# Patient Record
Sex: Female | Born: 2000 | Race: Black or African American | Hispanic: No | Marital: Single | Smoking: Never smoker
Health system: Southern US, Community
[De-identification: ages and names within clinical notes are randomized; demographics above are authoritative.]

## PROBLEM LIST (undated history)

## (undated) HISTORY — PX: OTHER SURGICAL HISTORY: SHX169

## (undated) HISTORY — PX: TONSILLECTOMY: SUR1361

---
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
## 2020-02-20 ENCOUNTER — Other Ambulatory Visit: Payer: Self-pay

## 2020-02-20 ENCOUNTER — Emergency Department
Admission: EM | Admit: 2020-02-20 | Discharge: 2020-02-20 | Disposition: A | Discharge status: Home / Self Care | Payer: No Typology Code available for payment source | Attending: Student in an Organized Health Care Education/Training Program | Admitting: Student in an Organized Health Care Education/Training Program

## 2020-02-20 DIAGNOSIS — R197 Diarrhea, unspecified: Secondary | ICD-10-CM | POA: Diagnosis not present

## 2020-02-20 DIAGNOSIS — R112 Nausea with vomiting, unspecified: Secondary | ICD-10-CM | POA: Diagnosis not present

## 2020-02-20 LAB — COMPREHENSIVE METABOLIC PANEL
ALT: 22 U/L (ref 0–44)
AST: 30 U/L (ref 15–41)
Albumin: 3.4 g/dL — ABNORMAL LOW (ref 3.5–5.0)
Alkaline Phosphatase: 55 U/L (ref 38–126)
Anion gap: 7 (ref 5–15)
BUN: 6 mg/dL (ref 6–20)
CO2: 27 mmol/L (ref 22–32)
Calcium: 8.9 mg/dL (ref 8.9–10.3)
Chloride: 103 mmol/L (ref 98–111)
Creatinine, Ser: 0.74 mg/dL (ref 0.44–1.00)
GFR calc Af Amer: >60 mL/min (ref >60)
GFR calc non Af Amer: >60 mL/min (ref >60)
Glucose, Bld: 86 mg/dL (ref 70–99)
Potassium: 3.6 mmol/L (ref 3.5–5.1)
Sodium: 137 mmol/L (ref 135–145)
Total Bilirubin: 0.5 mg/dL (ref 0.3–1.2)
Total Protein: 8.5 g/dL — ABNORMAL HIGH (ref 6.5–8.1)

## 2020-02-20 LAB — URINALYSIS, COMPLETE (UACMP) WITH MICROSCOPIC
Bacteria, UA: NONE SEEN
Bilirubin Urine: NEGATIVE
Glucose, UA: NEGATIVE mg/dL
Ketones, ur: NEGATIVE mg/dL
Nitrite: NEGATIVE
Protein, ur: 100 mg/dL — AB
RBC / HPF: >50 RBC/hpf — ABNORMAL HIGH (ref 0–5)
Specific Gravity, Urine: 1.026 (ref 1.005–1.030)
pH: 5 (ref 5.0–8.0)

## 2020-02-20 LAB — CBC
HCT: 34 % — ABNORMAL LOW (ref 36.0–46.0)
Hemoglobin: 10.3 g/dL — ABNORMAL LOW (ref 12.0–15.0)
MCH: 22.4 pg — ABNORMAL LOW (ref 26.0–34.0)
MCHC: 30.3 g/dL (ref 30.0–36.0)
MCV: 74.1 fL — ABNORMAL LOW (ref 80.0–100.0)
Platelets: 401 10*3/uL — ABNORMAL HIGH (ref 150–400)
RBC: 4.59 MIL/uL (ref 3.87–5.11)
RDW: 16.1 % — ABNORMAL HIGH (ref 11.5–15.5)
WBC: 5.5 10*3/uL (ref 4.0–10.5)
nRBC: 0 % (ref 0.0–0.2)

## 2020-02-20 LAB — POCT PREGNANCY, URINE: Preg Test, Ur: NEGATIVE

## 2020-02-20 LAB — LIPASE, BLOOD: Lipase: 16 U/L (ref 11–51)

## 2020-02-20 MED ORDER — ONDANSETRON 4 MG PO TBDP: 4.0000 mg | ORAL_TABLET | Freq: Three times a day (TID) | ORAL | 0 refills | Status: DC | PRN

## 2020-02-20 MED ORDER — SODIUM CHLORIDE 0.9% FLUSH: 3.0000 mL | Freq: Once | INTRAVENOUS | Status: DC

## 2020-02-20 NOTE — ED Triage Notes (Signed)
PT to ER states n/v/d since Wednesday.  States last was last night.  States today at work became lightheaded.

## 2020-02-20 NOTE — ED Notes (Signed)
See triage note- pt nvd since Wednesday. Also c/o dizziness and weakness.

## 2020-02-20 NOTE — ED Notes (Signed)
Pt given blanket.

## 2020-02-20 NOTE — Discharge Instructions (Signed)
Please follow up with primary care.  Return to the ER for symptoms that change or worsen if unable to schedule an appointment. 

## 2020-02-20 NOTE — ED Provider Notes (Signed)
[facility] Emergency Department Provider Note ____________________________________________   First MD Initiated Contact with Patient 02/20/20 1751     (approximate)  I have reviewed the triage vital signs and the nursing notes.   HISTORY  Chief Complaint Emesis, Nausea, and Diarrhea  HPI Kathy Burke is a 19 y.o. female who presents to the emergency department for treatment and evaluation of nausea, vomiting, and diarrhea x 3 days with dizziness today while at work. Last episode of vomiting or diarrhea was last night. She also has a mild sore throat that she believes is related to allergies. Some relief with zofran.     No past medical history on file.  Patient Active Problem List   Diagnosis Date Noted  . Irregular menses 03/05/2018  . ADD (attention deficit disorder) 04/05/2015  . Allergic rhinitis 04/05/2015  . Arthritis 04/05/2015  . Dermatitis, eczematoid 04/05/2015  . Encounter for physical therapy 04/05/2015  . Family history of diabetes mellitus type II 04/05/2015  . Blood glucose elevated 04/05/2015  . Cannot sleep 04/05/2015  . Anemia, iron deficiency 04/05/2015  . Adiposity 04/05/2015  . Anterior knee pain 04/05/2015    Past Surgical History:  Procedure Laterality Date  . pt denies    . TONSILLECTOMY  as a baby    Prior to Admission medications   Medication Sig Start Date End Date Taking? Authorizing Provider  amphetamine-dextroamphetamine (ADDERALL XR) 20 MG 24 hr capsule Take 1 capsule (20 mg total) by mouth daily. 08/13/19   Mar Daring, PA-C  levonorgestrel (MIRENA) 20 MCG/24HR IUD 1 each by Intrauterine route once.    [provider]  loratadine (CLARITIN) 10 MG tablet Take by mouth.    [provider]  metroNIDAZOLE (FLAGYL) 500 MG tablet Take 1 tablet (500 mg total) by mouth 2 (two) times daily. 08/13/19   Mar Daring, PA-C  ondansetron (ZOFRAN-ODT) 4 MG disintegrating tablet Take 1 tablet  (4 mg total) by mouth every 8 (eight) hours as needed for nausea or vomiting. 02/20/20   Timiyah Romito B, FNP  triamcinolone cream (KENALOG) 0.1 % Apply 1 application topically 2 (two) times daily. Apply 2-3 times a day for eczema 03/13/16   Margarita Rana, MD    Allergies Patient has no known allergies.  Family History  Problem Relation Age of Onset  . Hypertension Mother   . Hypertension Father   . Diabetes Maternal Grandmother   . Diabetes Paternal Grandmother     Social History Social History   Tobacco Use  . Smoking status: Never Smoker  . Smokeless tobacco: Never Used  Substance Use Topics  . Alcohol use: No  . Drug use: No    Review of Systems  Constitutional: No fever/chills Eyes: No visual changes. ENT: No sore throat. Cardiovascular: Denies chest pain. Respiratory: Denies shortness of breath. Gastrointestinal: No abdominal pain. Positive for nausea, vomiting, and diarrhea.  No constipation. Genitourinary: Negative for dysuria. Musculoskeletal: Negative for back pain. Skin: Negative for rash. Neurological: Negative for headaches, focal weakness or numbness. ___________________________________________   PHYSICAL EXAM:  VITAL SIGNS: ED Triage Vitals  Enc Vitals Group     BP 02/20/20 1555 102/70     Pulse Rate 02/20/20 1555 (!) 105     Resp 02/20/20 1555 16     Temp 02/20/20 1555 98.4 F (36.9 C)     Temp Source 02/20/20 1555 Oral     SpO2 02/20/20 1555 95 %     Weight 02/20/20 1555 300 lb (136.1  kg)     Height 02/20/20 1555 5\' 11"  (1.803 m)     Head Circumference --      Peak Flow --      Pain Score 02/20/20 1602 0     Pain Loc --      Pain Edu? --      Excl. in GC? --     Constitutional: Alert and oriented. Well appearing and in no acute distress. Eyes: Conjunctivae are normal. Head: Atraumatic. Nose: No congestion/rhinnorhea. Mouth/Throat: Mucous membranes are moist.  Oropharynx mildly erythematous with cobblestone exudate. Neck: No stridor.    Hematological/Lymphatic/Immunilogical: No cervical lymphadenopathy. Cardiovascular: Normal rate, regular rhythm. Grossly normal heart sounds.  Good peripheral circulation. Respiratory: Normal respiratory effort.  No retractions. Lungs CTAB. Gastrointestinal: Soft and nontender. No distention. No abdominal bruits. No CVA tenderness. Genitourinary:  Musculoskeletal: No lower extremity tenderness nor edema.  No joint effusions. Neurologic:  Normal speech and language. No gross focal neurologic deficits are appreciated. No gait instability. Skin:  Skin is warm, dry and intact. No rash noted. Psychiatric: Mood and affect are normal. Speech and behavior are normal.  ____________________________________________   LABS (all labs ordered are listed, but only abnormal results are displayed)  Labs Reviewed  COMPREHENSIVE METABOLIC PANEL - Abnormal; Notable for the following components:      Result Value   Total Protein 8.5 (*)    Albumin 3.4 (*)    All other components within normal limits  CBC - Abnormal; Notable for the following components:   Hemoglobin 10.3 (*)    HCT 34.0 (*)    MCV 74.1 (*)    MCH 22.4 (*)    RDW 16.1 (*)    Platelets 401 (*)    All other components within normal limits  URINALYSIS, COMPLETE (UACMP) WITH MICROSCOPIC - Abnormal; Notable for the following components:   Color, Urine AMBER (*)    APPearance CLOUDY (*)    Hgb urine dipstick LARGE (*)    Protein, ur 100 (*)    Leukocytes,Ua SMALL (*)    RBC / HPF >50 (*)    All other components within normal limits  LIPASE, BLOOD  POC URINE PREG, ED  POCT PREGNANCY, URINE   ____________________________________________  EKG  Not indicated. ____________________________________________  RADIOLOGY  ED MD interpretation:    Official radiology report(s): No results found.  ____________________________________________   PROCEDURES  Procedure(s) performed (including Critical  Care):  Procedures  ____________________________________________   INITIAL IMPRESSION / ASSESSMENT AND PLAN   19 year old female presenting to the emergency department for treatment and evaluation of dizziness after having had a few days of nausea, vomiting, and diarrhea.  She is tolerating fluids and food well today.  Exam is overall reassuring.  Lab studies do not indicate dehydration.  She is noted to be anemic however this is chronic.  Urinalysis shows that she has large amount hemoglobin, small amount of leukocytes, greater than 50 red blood cells with 21-50 white blood cells as well as 11-20 squamous epithelial cells.  Patient denies urinary symptoms.  No treatment indicated at this time.  DIFFERENTIAL DIAGNOSIS  Dehydration, acute kidney injury, viral gastroenteritis, COVID-19  ED COURSE  Patient will be discharged home with a prescription for Zofran and advised to take some type of over-the-counter allergy medication.  If she develops urinary symptoms or other concerns, she was advised to follow-up with her primary care provider.  If she is unable to schedule an appointment she should return to the emergency department. ____________________________________________  FINAL CLINICAL IMPRESSION(S) / ED DIAGNOSES  Final diagnoses:  Nausea vomiting and diarrhea     ED Discharge Orders         Ordered    ondansetron (ZOFRAN-ODT) 4 MG disintegrating tablet  Every 8 hours PRN     02/20/20 2013           RAYAN DYAL was evaluated in Emergency Department on 02/21/2020 for the symptoms described in the history of present illness. She was evaluated in the context of the global COVID-19 pandemic, which necessitated consideration that the patient might be at risk for infection with the SARS-CoV-2 virus that causes COVID-19. Institutional protocols and algorithms that pertain to the evaluation of patients at risk for COVID-19 are in a state of rapid change based on information  released by regulatory bodies including the CDC and federal and state organizations. These policies and algorithms were followed during the patient's care in the ED.   Note:  This document was prepared using Dragon voice recognition software and may include unintentional dictation errors.   Chinita Pester, FNP 02/21/20 2109    Willy Eddy, MD 02/23/20 [phone]

## 2020-04-16 ENCOUNTER — Ambulatory Visit: Payer: No Typology Code available for payment source | Attending: Oncology

## 2020-04-16 DIAGNOSIS — Z23 Encounter for immunization: Secondary | ICD-10-CM

## 2020-04-16 NOTE — Progress Notes (Signed)
   Covid-19 Vaccination Clinic  Name:  Kathy Burke    MRN: [id number] DOB: [birth year]  04/16/2020  [name] was observed post Covid-19 immunization for 15 minutes without incident. She was provided with Vaccine Information Sheet and instruction to access the V-Safe system.   [name] was instructed to call 911 with any severe reactions post vaccine: Marland Kitchen Difficulty breathing  . Swelling of face and throat  . A fast heartbeat  . A bad rash all over body  . Dizziness and weakness   Immunizations Administered    Name Date Dose VIS Date Route   Pfizer COVID-19 Vaccine 04/16/2020  9:45 AM 0.3 mL 01/27/2019 Intramuscular   Manufacturer: [employer], [employer]   Lot: [id number]   NDC: [address]-7

## 2020-05-10 ENCOUNTER — Ambulatory Visit: Payer: No Typology Code available for payment source | Attending: Internal Medicine

## 2020-05-10 DIAGNOSIS — Z23 Encounter for immunization: Secondary | ICD-10-CM

## 2020-05-10 NOTE — Progress Notes (Signed)
   Covid-19 Vaccination Clinic  Name:  Kathy Burke    MRN: [id number] DOB: 06-Jul-2001  05/10/2020  [name] was observed post Covid-19 immunization for 15 minutes without incident. She was provided with Vaccine Information Sheet and instruction to access the V-Safe system.   [name] was instructed to call 911 with any severe reactions post vaccine: Marland Kitchen Difficulty breathing  . Swelling of face and throat  . A fast heartbeat  . A bad rash all over body  . Dizziness and weakness   Immunizations Administered    Name Date Dose VIS Date Route   Pfizer COVID-19 Vaccine 05/10/2020  9:44 AM 0.3 mL 01/27/2019 Intramuscular   Manufacturer: [employer], [employer]   Lot: [id number]   NDC: [address]-2

## 2020-07-12 ENCOUNTER — Encounter: Payer: Self-pay | Admitting: Emergency Medicine

## 2020-07-12 ENCOUNTER — Emergency Department: Payer: No Typology Code available for payment source

## 2020-07-12 ENCOUNTER — Other Ambulatory Visit: Payer: Self-pay

## 2020-07-12 ENCOUNTER — Emergency Department
Admission: EM | Admit: 2020-07-12 | Discharge: 2020-07-12 | Disposition: A | Discharge status: Home / Self Care | Payer: No Typology Code available for payment source | Attending: Emergency Medicine | Admitting: Emergency Medicine

## 2020-07-12 DIAGNOSIS — F909 Attention-deficit hyperactivity disorder, unspecified type: Secondary | ICD-10-CM | POA: Insufficient documentation

## 2020-07-12 DIAGNOSIS — D509 Iron deficiency anemia, unspecified: Secondary | ICD-10-CM | POA: Insufficient documentation

## 2020-07-12 DIAGNOSIS — R1011 Right upper quadrant pain: Secondary | ICD-10-CM | POA: Insufficient documentation

## 2020-07-12 DIAGNOSIS — K2900 Acute gastritis without bleeding: Secondary | ICD-10-CM

## 2020-07-12 DIAGNOSIS — E669 Obesity, unspecified: Secondary | ICD-10-CM | POA: Diagnosis not present

## 2020-07-12 DIAGNOSIS — K2901 Acute gastritis with bleeding: Secondary | ICD-10-CM | POA: Diagnosis not present

## 2020-07-12 DIAGNOSIS — N3000 Acute cystitis without hematuria: Secondary | ICD-10-CM | POA: Diagnosis not present

## 2020-07-12 DIAGNOSIS — D649 Anemia, unspecified: Secondary | ICD-10-CM | POA: Insufficient documentation

## 2020-07-12 DIAGNOSIS — N3090 Cystitis, unspecified without hematuria: Secondary | ICD-10-CM

## 2020-07-12 LAB — COMPREHENSIVE METABOLIC PANEL
ALT: 25 U/L (ref 0–44)
AST: 36 U/L (ref 15–41)
Albumin: 3.5 g/dL (ref 3.5–5.0)
Alkaline Phosphatase: 55 U/L (ref 38–126)
Anion gap: 8 (ref 5–15)
BUN: 7 mg/dL (ref 6–20)
CO2: 25 mmol/L (ref 22–32)
Calcium: 9 mg/dL (ref 8.9–10.3)
Chloride: 104 mmol/L (ref 98–111)
Creatinine, Ser: 0.71 mg/dL (ref 0.44–1.00)
GFR calc Af Amer: >60 mL/min (ref >60)
GFR calc non Af Amer: >60 mL/min (ref >60)
Glucose, Bld: 82 mg/dL (ref 70–99)
Potassium: 4.4 mmol/L (ref 3.5–5.1)
Sodium: 137 mmol/L (ref 135–145)
Total Bilirubin: 0.8 mg/dL (ref 0.3–1.2)
Total Protein: 9 g/dL — ABNORMAL HIGH (ref 6.5–8.1)

## 2020-07-12 LAB — URINALYSIS, COMPLETE (UACMP) WITH MICROSCOPIC
Bilirubin Urine: NEGATIVE
Glucose, UA: NEGATIVE mg/dL
Ketones, ur: NEGATIVE mg/dL
Nitrite: NEGATIVE
Protein, ur: 30 mg/dL — AB
Specific Gravity, Urine: 1.025 (ref 1.005–1.030)
WBC, UA: >50 WBC/hpf — ABNORMAL HIGH (ref 0–5)
pH: 7 (ref 5.0–8.0)

## 2020-07-12 LAB — CBC
HCT: 35.7 % — ABNORMAL LOW (ref 36.0–46.0)
Hemoglobin: 10.6 g/dL — ABNORMAL LOW (ref 12.0–15.0)
MCH: 22.3 pg — ABNORMAL LOW (ref 26.0–34.0)
MCHC: 29.7 g/dL — ABNORMAL LOW (ref 30.0–36.0)
MCV: 75 fL — ABNORMAL LOW (ref 80.0–100.0)
Platelets: 272 10*3/uL (ref 150–400)
RBC: 4.76 MIL/uL (ref 3.87–5.11)
RDW: 17.1 % — ABNORMAL HIGH (ref 11.5–15.5)
WBC: 5.7 10*3/uL (ref 4.0–10.5)
nRBC: 0 % (ref 0.0–0.2)

## 2020-07-12 LAB — LIPASE, BLOOD: Lipase: 24 U/L (ref 11–51)

## 2020-07-12 LAB — PREGNANCY, URINE: Preg Test, Ur: NEGATIVE

## 2020-07-12 MED ORDER — FENTANYL CITRATE (PF) 100 MCG/2ML IJ SOLN: 50.0000 ug | Freq: Once | INTRAMUSCULAR | Status: DC

## 2020-07-12 MED ORDER — ALUM & MAG HYDROXIDE-SIMETH 200-200-20 MG/5ML PO SUSP
30.0000 mL | Freq: Once | ORAL | Status: AC
  Administered 2020-07-12: 30 mL via ORAL
  Filled 2020-07-12: qty 30

## 2020-07-12 MED ORDER — CEFUROXIME AXETIL 500 MG PO TABS: 500.0000 mg | ORAL_TABLET | Freq: Two times a day (BID) | ORAL | 0 refills | Status: AC

## 2020-07-12 MED ORDER — ACETAMINOPHEN 500 MG PO TABS
1000.0000 mg | ORAL_TABLET | Freq: Once | ORAL | Status: AC
  Administered 2020-07-12: 1000 mg via ORAL
  Filled 2020-07-12: qty 2

## 2020-07-12 NOTE — ED Triage Notes (Signed)
Patient presents to the ED with epigastric pain radiating into right upper quadrant. Patient states pain is sharp.  Patient states pain was worse when she tried to move during the night. Patient states pain began around noon while she was working at work.  Patient denies vomiting and diarrhea.  Patient states area is slightly tender.  Patient is in no obvious distress at this time.

## 2020-07-12 NOTE — ED Provider Notes (Addendum)
[facility] Emergency Department Provider Note  ____________________________________________   First MD Initiated Contact with Patient 07/12/20 1337     (approximate)  I have reviewed the triage vital signs and the nursing notes.   HISTORY  Chief Complaint Abdominal Pain   HPI Kathy Burke is a 19 y.o. female with a past medical history of ADD, arthritis, and iron deficiency anemia who presents for assessment of approximately 2 days of right upper quadrant pain.  Patient states it is crampy and comes and goes but sometimes is persistent.  No prior similar episodes.  No clear alleviating or aggravating factors including food or ibuprofen.  Patient does note she is not a regular ibuprofen taker.  She denies any headache, earache, sore throat, fevers, cough, chest pain, abdominal pain on the left or below the umbilicus, back pain, burning with urination, diarrhea, blood in her stool, blood in her urine, rash, or other acute complaints.  Denies EtOH use, illegal drug use, tobacco abuse.  No other acute concerns at this time.         History reviewed. No pertinent past medical history.  Patient Active Problem List   Diagnosis Date Noted  . Irregular menses 03/05/2018  . ADD (attention deficit disorder) 04/05/2015  . Allergic rhinitis 04/05/2015  . Arthritis 04/05/2015  . Dermatitis, eczematoid 04/05/2015  . Encounter for physical therapy 04/05/2015  . Family history of diabetes mellitus type II 04/05/2015  . Blood glucose elevated 04/05/2015  . Cannot sleep 04/05/2015  . Anemia, iron deficiency 04/05/2015  . Adiposity 04/05/2015  . Anterior knee pain 04/05/2015    Past Surgical History:  Procedure Laterality Date  . pt denies    . TONSILLECTOMY  as a baby    Prior to Admission medications   Medication Sig Start Date End Date Taking? Authorizing Provider  amphetamine-dextroamphetamine (ADDERALL XR) 20 MG 24 hr capsule Take 1 capsule (20 mg total)  by mouth daily. 08/13/19   [name], PA-C  cefUROXime (CEFTIN) 500 MG tablet Take 1 tablet (500 mg total) by mouth 2 (two) times daily with a meal for 7 days. 07/12/20 07/19/20  Gilles Chiquito, MD  levonorgestrel (MIRENA) 20 MCG/24HR IUD 1 each by Intrauterine route once.    [provider]  loratadine (CLARITIN) 10 MG tablet Take by mouth.    [provider]    Allergies Patient has no known allergies.  Family History  Problem Relation Age of Onset  . Hypertension Mother   . Hypertension Father   . Diabetes Maternal Grandmother   . Diabetes Paternal Grandmother     Social History Social History   Tobacco Use  . Smoking status: Never Smoker  . Smokeless tobacco: Never Used  Substance Use Topics  . Alcohol use: No  . Drug use: No    Review of Systems  Review of Systems  Constitutional: Negative for chills and fever.  HENT: Negative for sore throat.   Eyes: Negative for pain.  Respiratory: Negative for cough and stridor.   Cardiovascular: Negative for chest pain.  Gastrointestinal: Positive for abdominal pain. Negative for vomiting.  Skin: Negative for rash.  Neurological: Negative for seizures, loss of consciousness and headaches.  Psychiatric/Behavioral: Negative for suicidal ideas.  All other systems reviewed and are negative.     ____________________________________________   PHYSICAL EXAM:  VITAL SIGNS: ED Triage Vitals  Enc Vitals Group     BP 07/12/20 1234 (!) 112/58     Pulse Rate 07/12/20 1234  92     Resp 07/12/20 1234 17     Temp 07/12/20 1234 98.4 F (36.9 C)     Temp Source 07/12/20 1234 Oral     SpO2 07/12/20 1234 97 %     Weight 07/12/20 1256 280 lb (127 kg)     Height 07/12/20 1256 5\' 11"  (1.803 m)     Head Circumference --      Peak Flow --      Pain Score 07/12/20 1256 7     Pain Loc --      Pain Edu? --      Excl. in GC? --    Vitals:   07/12/20 1234  BP: (!) 112/58  Pulse: 92  Resp: 17  Temp: 98.4  F (36.9 C)  SpO2: 97%   Physical Exam Vitals and nursing note reviewed.  Constitutional:      General: She is not in acute distress.    Appearance: She is well-developed. She is obese.  HENT:     Head: Normocephalic and atraumatic.     Right Ear: External ear normal.     Left Ear: External ear normal.     Nose: Nose normal.     Mouth/Throat:     Mouth: Mucous membranes are moist.  Eyes:     Conjunctiva/sclera: Conjunctivae normal.  Cardiovascular:     Rate and Rhythm: Normal rate and regular rhythm.     Heart sounds: No murmur heard.   Pulmonary:     Effort: Pulmonary effort is normal. No respiratory distress.     Breath sounds: Normal breath sounds.  Abdominal:     General: There is no distension.     Palpations: Abdomen is soft. There is no mass.     Tenderness: There is abdominal tenderness in the right upper quadrant, epigastric area and suprapubic area. There is no right CVA tenderness, left CVA tenderness, guarding or rebound.     Hernia: No hernia is present.  Musculoskeletal:     Cervical back: Neck supple.  Skin:    General: Skin is warm and dry.     Capillary Refill: Capillary refill takes less than 2 seconds.  Neurological:     Mental Status: She is alert and oriented to person, place, and time.  Psychiatric:        Mood and Affect: Mood normal.      ____________________________________________   LABS (all labs ordered are listed, but only abnormal results are displayed)  Labs Reviewed  COMPREHENSIVE METABOLIC PANEL - Abnormal; Notable for the following components:      Result Value   Total Protein 9.0 (*)    All other components within normal limits  CBC - Abnormal; Notable for the following components:   Hemoglobin 10.6 (*)    HCT 35.7 (*)    MCV 75.0 (*)    MCH 22.3 (*)    MCHC 29.7 (*)    RDW 17.1 (*)    All other components within normal limits  URINALYSIS, COMPLETE (UACMP) WITH MICROSCOPIC - Abnormal; Notable for the following components:     Color, Urine YELLOW (*)    APPearance CLOUDY (*)    Hgb urine dipstick LARGE (*)    Protein, ur 30 (*)    Leukocytes,Ua MODERATE (*)    WBC, UA >50 (*)    Bacteria, UA RARE (*)    All other components within normal limits  URINE CULTURE  LIPASE, BLOOD  PREGNANCY, URINE   ____________________________________________  EKG  Sinus  rhythm with a ventricular rate of 90, normal axis, unremarkable intervals, no evidence of acute ischemia or other nonarrhythmia. ____________________________________________  RADIOLOGY  Official radiology report(s): US Abdomen Limited RUQ  Result Date: 07/12/2020 CLINICAL DATA:  Upper abdominal pain EXAM: ULTRASOUND ABDOMEN LIMITED RIGHT UPPER QUADRANT COMPARISON:  None. FINDINGS: Gallbladder: No gallstones or wall thickening visualized. There is no pericholecystic fluid. No sonographic Murphy sign noted by sonographer. Common bile duct: Diameter: 2 mm. No intrahepatic or extrahepatic biliary duct dilatation. Liver: No focal lesion identified. Within normal limits in parenchymal echogenicity. Portal vein is patent on color Doppler imaging with normal direction of blood flow towards the liver. Other: None. IMPRESSION: Study within normal limits. Electronically Signed   By: Bretta Bang III M.D.   On: 07/12/2020 14:40    ____________________________________________   PROCEDURES  Procedure(s) performed (including Critical Care):  Procedures   ____________________________________________   INITIAL IMPRESSION / ASSESSMENT AND PLAN / ED COURSE        Patient presents with above-stated history exam for assessment of right upper quadrant pain.  Afebrile and hemodynamically stable arrival.  Exam as above remarkable for some very minimal tenderness in the right upper quadrant and epigastric region.  Patient also has some mild tenderness in her suprapubic area but no tenderness in her right lower quadrant or left lower quadrant, no rebound tenderness,  no guarding, and no pain on ranging of the right lower extremity or left lower extremity.  Low suspicion for acute appendicitis at this time..  No evidence of acute pancreatitis, acute cholecystitis, acute cholestatic process, or other significant electrolyte or metabolic derangement on above work-up.  History and work-up is not consistent with pyelonephritis although UA is consistent with cystitis.  In addition is possible patient has some mild gastritis given her epigastric discomfort and a couple tablets of ibuprofen the last couple days.  Patient was treated with Maalox and Rx written as noted below.  Discharge stable condition.  Strict return cautions advised and discussed.  Medications  alum & mag hydroxide-simeth (MAALOX/MYLANTA) 200-200-20 MG/5ML suspension 30 mL (has no administration in time range)  acetaminophen (TYLENOL) tablet 1,000 mg (1,000 mg Oral Given 07/12/20 1442)   ____________________________________________   FINAL CLINICAL IMPRESSION(S) / ED DIAGNOSES  Final diagnoses:  RUQ pain  Acute gastritis without hemorrhage, unspecified gastritis type  Anemia, unspecified type  Cystitis     ED Discharge Orders         Ordered    cefUROXime (CEFTIN) 500 MG tablet  2 times daily with meals     Discontinue  Reprint     07/12/20 1503           Note:  This document was prepared using Dragon voice recognition software and may include unintentional dictation errors.   Gilles Chiquito, MD 07/12/20 1508    Gilles Chiquito, MD 07/12/20 [phone]

## 2020-07-14 LAB — URINE CULTURE: Culture: >=100000 — AB

## 2020-12-05 ENCOUNTER — Other Ambulatory Visit: Payer: Self-pay

## 2020-12-05 ENCOUNTER — Encounter: Payer: Self-pay | Admitting: Family Medicine

## 2020-12-05 ENCOUNTER — Ambulatory Visit (INDEPENDENT_AMBULATORY_CARE_PROVIDER_SITE_OTHER): Payer: No Typology Code available for payment source | Admitting: Family Medicine

## 2020-12-05 ENCOUNTER — Other Ambulatory Visit: Payer: Self-pay | Admitting: Family Medicine

## 2020-12-05 VITALS — BP 128/86 | HR 96 | Temp 97.3°F | Resp 16 | Ht 71.0 in | Wt 316.0 lb

## 2020-12-05 DIAGNOSIS — S39012A Strain of muscle, fascia and tendon of lower back, initial encounter: Secondary | ICD-10-CM | POA: Diagnosis not present

## 2020-12-05 MED ORDER — CYCLOBENZAPRINE HCL 5 MG PO TABS: 5.0000 mg | ORAL_TABLET | Freq: Every day | ORAL | 1 refills | Status: DC

## 2020-12-05 MED ORDER — IBUPROFEN 800 MG PO TABS: 800.0000 mg | ORAL_TABLET | Freq: Three times a day (TID) | ORAL | 1 refills | Status: DC

## 2020-12-05 NOTE — Progress Notes (Signed)
      Established patient visit   Patient: Kathy Burke   DOB: Mar 23, 2001   19 y.o. Female  MRN: [id number] Visit Date: 12/05/2020  Today's healthcare provider: Mila Merry, MD   Chief Complaint  Patient presents with  . Back Pain   Subjective    Back Pain This is a new problem. The current episode started in the past 7 days (about 5 days ). The problem is unchanged. The pain is present in the lumbar spine. The quality of the pain is described as aching and shooting. The pain is mild. The pain is the same all the time. The symptoms are aggravated by bending and twisting. She has tried NSAIDs for the symptoms. The treatment provided no relief.   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 The pain has not improved at all since then, and has moved from the left side of her back to the middle of her back.       Medications: Outpatient Medications Prior to Visit  Medication Sig  . amphetamine-dextroamphetamine (ADDERALL XR) 20 MG 24 hr capsule Take 1 capsule (20 mg total) by mouth daily.  Marland Kitchen levonorgestrel (MIRENA) 20 MCG/24HR IUD 1 each by Intrauterine route once.  . loratadine (CLARITIN) 10 MG tablet Take by mouth.   No facility-administered medications prior to visit.    Review of Systems  Musculoskeletal: Positive for back pain.      Objective    BP 128/86   Pulse 96   Temp (!) 97.3 F (36.3 C)   Resp 16   Ht 5\' 11"  (1.803 m)   Wt (!) 316 lb (143.3 kg)   BMI 44.07 kg/m    Physical Exam  General appearance: Severely obese female, cooperative and in no acute distress Head: Normocephalic, without obvious abnormality, atraumatic Respiratory: Respirations even and unlabored, normal respiratory rate MS; diffuse muscle tenderness lower thoracic and lumbar back left of and at midline. No gross deformities.    Assessment & Plan     1. Back strain, initial  encounter Relative rest, can start applying heat, may alternate with ice.   - ibuprofen (ADVIL) 800 MG tablet; Take 1 tablet (800 mg total) by mouth 3 (three) times daily.  Dispense: 30 tablet; Refill: 1 - cyclobenzaprine (FLEXERIL) 5 MG tablet; Take 1-2 tablets (5-10 mg total) by mouth at bedtime.  Dispense: 20 tablet; Refill: 1   Call if symptoms change or if not rapidly improving.       The entirety of the information documented in the History of Present Illness, Review of Systems and Physical Exam were personally obtained by me. Portions of this information were initially documented by the CMA and reviewed by me for thoroughness and accuracy.      , MD  [facility] [phone] (phone) [phone] (fax)  [facility]

## 2021-01-20 ENCOUNTER — Other Ambulatory Visit: Payer: Self-pay

## 2021-01-20 ENCOUNTER — Ambulatory Visit
Admission: EM | Admit: 2021-01-20 | Discharge: 2021-01-20 | Disposition: A | Discharge status: Home / Self Care | Payer: No Typology Code available for payment source | Attending: Internal Medicine | Admitting: Internal Medicine

## 2021-01-20 ENCOUNTER — Encounter: Payer: Self-pay | Admitting: Emergency Medicine

## 2021-01-20 ENCOUNTER — Other Ambulatory Visit: Payer: Self-pay | Admitting: Internal Medicine

## 2021-01-20 DIAGNOSIS — N760 Acute vaginitis: Secondary | ICD-10-CM | POA: Diagnosis not present

## 2021-01-20 DIAGNOSIS — B9689 Other specified bacterial agents as the cause of diseases classified elsewhere: Secondary | ICD-10-CM | POA: Diagnosis not present

## 2021-01-20 LAB — URINALYSIS, COMPLETE (UACMP) WITH MICROSCOPIC
Bilirubin Urine: NEGATIVE
Glucose, UA: NEGATIVE mg/dL
Nitrite: NEGATIVE
Specific Gravity, Urine: 1.025 (ref 1.005–1.030)
pH: 6.5 (ref 5.0–8.0)

## 2021-01-20 LAB — WET PREP, GENITAL
Sperm: NONE SEEN
Trich, Wet Prep: NONE SEEN
Yeast Wet Prep HPF POC: NONE SEEN

## 2021-01-20 LAB — PREGNANCY, URINE: Preg Test, Ur: NEGATIVE

## 2021-01-20 MED ORDER — METRONIDAZOLE 500 MG PO TABS: 500.0000 mg | ORAL_TABLET | Freq: Two times a day (BID) | ORAL | 0 refills | Status: DC

## 2021-01-20 NOTE — ED Triage Notes (Signed)
Patient states that she had upper abdominal pain, vomiting and diarrhea that started yesterday.  Patient also reports vaginal discharge and odor.  Patient denies urinary symptoms.  Patient denies any cold symptoms.

## 2021-01-20 NOTE — Discharge Instructions (Addendum)
Take medications as directed We have sent your urine for cultures If culture is positive we will give you call Tylenol or Motrin as needed for pain Return to urgent care if your symptoms worsen.

## 2021-01-20 NOTE — ED Provider Notes (Signed)
[facility]    CSN: [id number] Arrival date & time: 01/20/21  1523      History   Chief Complaint Chief Complaint  Patient presents with  . Abdominal Pain  . Vaginal Discharge  . Diarrhea    HPI Kathy Burke is a 20 y.o. female comes to urgent care with complaints of abdominal pain of a couple of days duration.  Pain is described as in both flanks with no known aggravating or relieving factors.  Patient had some nausea associated with that as well as a bout of diarrhea.  No fever or chills.  No history of constipation.  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  No trauma.  Pain is not aggravated by food  Patient also complains of discolored malodorous vaginal discharge.  This started a few days ago.  No itching.  Patient is not sexually active.  She denies douching.  No lower abdominal pain.  No abdominal distention.   She denies any dysuria, urgency or frequency. HPI  History reviewed. No pertinent past medical history.  Patient Active Problem List   Diagnosis Date Noted  . Irregular menses 03/05/2018  . ADD (attention deficit disorder) 04/05/2015  . Allergic rhinitis 04/05/2015  . Arthritis 04/05/2015  . Dermatitis, eczematoid 04/05/2015  . Encounter for physical therapy 04/05/2015  . Family history of diabetes mellitus type II 04/05/2015  . Blood glucose elevated 04/05/2015  . Cannot sleep 04/05/2015  . Anemia, iron deficiency 04/05/2015  . Adiposity 04/05/2015  . Anterior knee pain 04/05/2015    Past Surgical History:  Procedure Laterality Date  . pt denies    . TONSILLECTOMY  as a baby    OB History   No obstetric history on file.      Home Medications    Prior to Admission medications   Medication Sig Start Date End Date Taking? Authorizing Provider  ibuprofen (ADVIL) 800 MG tablet Take 1 tablet (800 mg total) by mouth 3 (three) times daily. 12/05/20  Yes Malva Limes, MD  levonorgestrel (MIRENA) 20 MCG/24HR IUD 1 each  by Intrauterine route once.   Yes [provider]  loratadine (CLARITIN) 10 MG tablet Take by mouth.   Yes [provider]  metroNIDAZOLE (FLAGYL) 500 MG tablet Take 1 tablet (500 mg total) by mouth 2 (two) times daily. 01/20/21  Yes Damain Broadus, Britta Mccreedy, MD  amphetamine-dextroamphetamine (ADDERALL XR) 20 MG 24 hr capsule Take 1 capsule (20 mg total) by mouth daily. 08/13/19 01/20/21  [name], PA-C    Family History Family History  Problem Relation Age of Onset  . Hypertension Mother   . Hypertension Father   . Diabetes Maternal Grandmother   . Diabetes Paternal Grandmother     Social History Social History   Tobacco Use  . Smoking status: Never Smoker  . Smokeless tobacco: Never Used  Vaping Use  . Vaping Use: Never used  Substance Use Topics  . Alcohol use: No  . Drug use: No     Allergies   Patient has no known allergies.   Review of Systems Review of Systems  Constitutional: Negative for chills and fever.  HENT: Negative.   Respiratory: Negative.   Gastrointestinal: Positive for abdominal pain, nausea and vomiting.  Endocrine: Negative.   Genitourinary: Negative for dysuria, frequency and urgency.  Musculoskeletal: Negative.   Neurological: Negative.   Psychiatric/Behavioral: Negative.      Physical Exam Triage Vital Signs ED Triage Vitals  Enc Vitals Group  BP 01/20/21 1546 125/76     Pulse --      Resp 01/20/21 1546 15     Temp 01/20/21 1546 98.5 F (36.9 C)     Temp Source 01/20/21 1546 Oral     SpO2 01/20/21 1546 97 %     Weight 01/20/21 1542 (!) 315 lb 14.7 oz (143.3 kg)     Height 01/20/21 1542 5\' 11"  (1.803 m)     Head Circumference --      Peak Flow --      Pain Score 01/20/21 1542 7     Pain Loc --      Pain Edu? --      Excl. in GC? --    No data found.  Updated Vital Signs BP 125/76 (BP Location: Right Arm)   Temp 98.5 F (36.9 C) (Oral)   Resp 15   Ht 5\' 11"  (1.803 m)   Wt (!) 143.3 kg   SpO2 97%    BMI 44.06 kg/m   Visual Acuity Right Eye Distance:   Left Eye Distance:   Bilateral Distance:    Right Eye Near:   Left Eye Near:    Bilateral Near:     Physical Exam Vitals and nursing note reviewed.  Constitutional:      General: She is not in acute distress.    Appearance: She is not ill-appearing.  Cardiovascular:     Rate and Rhythm: Normal rate and regular rhythm.  Abdominal:     General: Abdomen is protuberant. Bowel sounds are normal. There is no distension or abdominal bruit. There are no signs of injury.     Palpations: Abdomen is soft. There is no shifting dullness, hepatomegaly or splenomegaly.     Tenderness: There is no abdominal tenderness.     Hernia: No hernia is present.  Neurological:     Mental Status: She is alert.      UC Treatments / Results  Labs (all labs ordered are listed, but only abnormal results are displayed) Labs Reviewed  WET PREP, GENITAL - Abnormal; Notable for the following components:      Result Value   Clue Cells Wet Prep HPF POC PRESENT (*)    WBC, Wet Prep HPF POC FEW (*)    All other components within normal limits  URINALYSIS, COMPLETE (UACMP) WITH MICROSCOPIC - Abnormal; Notable for the following components:   APPearance HAZY (*)    Hgb urine dipstick LARGE (*)    Ketones, ur TRACE (*)    Protein, ur TRACE (*)    Leukocytes,Ua TRACE (*)    Bacteria, UA FEW (*)    All other components within normal limits  URINE CULTURE  PREGNANCY, URINE    EKG   Radiology No results found.  Procedures Procedures (including critical care time)  Medications Ordered in UC Medications - No data to display  Initial Impression / Assessment and Plan / UC Course  I have reviewed the triage vital signs and the nursing notes.  Pertinent labs & imaging results that were available during my care of the patient were reviewed by me and considered in my medical decision making (see chart for details).     1.  Abdominal pain likely  gastritis: Over-the-counter famotidine If pain is persistent patient will need further evaluation. Urine cultures will be sent If urine cultures are significant, we will send the patient appropriate medications  2.  Acute vaginitis-bacterial vaginosis: Metronidazole 500 mg twice daily for 7 days Avoid alcohol intake  with the medications. We will call you if your labs are abnormal. Return to urgent care if symptoms worsen.  Final Clinical Impressions(s) / UC Diagnoses   Final diagnoses:  BV (bacterial vaginosis)     Discharge Instructions     Take medications as directed We have sent your urine for cultures If culture is positive we will give you call Tylenol or Motrin as needed for pain Return to urgent care if your symptoms worsen.   ED Prescriptions    Medication Sig Dispense Auth. Provider   metroNIDAZOLE (FLAGYL) 500 MG tablet Take 1 tablet (500 mg total) by mouth 2 (two) times daily. 14 tablet Maecie Sevcik, Britta Mccreedy, MD     PDMP not reviewed this encounter.   Merrilee Jansky, MD 01/20/21 [phone]

## 2021-01-23 ENCOUNTER — Telehealth (HOSPITAL_COMMUNITY): Payer: Self-pay | Admitting: Emergency Medicine

## 2021-01-23 ENCOUNTER — Other Ambulatory Visit (HOSPITAL_COMMUNITY): Payer: Self-pay | Admitting: Internal Medicine

## 2021-01-23 ENCOUNTER — Ambulatory Visit: Payer: No Typology Code available for payment source | Admitting: Physician Assistant

## 2021-01-23 LAB — URINE CULTURE: Culture: >=100000 — AB

## 2021-01-23 MED ORDER — NITROFURANTOIN MONOHYD MACRO 100 MG PO CAPS: 100.0000 mg | ORAL_CAPSULE | Freq: Two times a day (BID) | ORAL | 0 refills | Status: DC

## 2021-02-06 ENCOUNTER — Ambulatory Visit: Payer: Self-pay | Admitting: *Deleted

## 2021-02-06 NOTE — Telephone Encounter (Signed)
Patient advised that she needs ov for evaluation. Appointment scheduled tomorrow 02/07/2021 at 1:40pm with Osvaldo Angst, PA-C.

## 2021-02-06 NOTE — Telephone Encounter (Signed)
I returned her Kathy's call Kathy Burke answered.  Kathy is on the [place].   Kathy Burke is at work.  Pt was seen on 01/20/2021 at the [facility] in [facility] and diagnosed with bacterial vaginosis.  She had abd pain, discharge and odor as her symptoms.  They did a urine culture too and it came back positive for infection.  Urgent care called them 2 days later and let them know that and called in medication for it also but Kathy wasn't sure what it was but Kathy Burke took it as prescribed.  Kathy Burke's symptoms for  The BV and UTI resolved.  She is c/o the vaginal odor returning.   It went away for a couple of days but has now returned.   She is not having any other symptoms;  No abd pain, frequency, burning in vaginal area or with urination, no itching or redness after completing the Flagyl.  Kathy Burke is at work and doesn't have time to come in this week due to her work schedule.  Kathy is requesting Dr. Sherrie Mustache call in something for the odor since she has been treated for this already.  Kathy Burke, Kathy can be reached at [phone].  I sent my notes to [facility] for Dr. Sherrie Mustache.    Reason for Disposition . [1] Symptoms of a yeast infection (i.e., itchy, white discharge, not bad smelling) AND [2] not improved > 3 days following CARE ADVICE    Diagnosed with BV on 01/20/2021 at the urgent care.  Completed course of Flagyl.  Symptoms resolved except odor has returned.  Answer Assessment - Initial Assessment Questions 1. SYMPTOM: "What's the main symptom you're concerned about?" (e.g., pain, itching, dryness)     Kathy Burke had called in.   I returned her call.  She is on the [facility].  Kathy Burke is at work.    Kathy Burke is still having vaginal odor.  She has completed the antibiotic.  No abd pain, burning, frequency, itching or discharge.   Just the odor.   It went away for a couple of days after completing the Flagyl but has returned. She was seen at the urgent care on  01/20/2021 and diagnosed with bacterial vaginosis.  Prescribed Flagyl and completed all of it.   The urgent care called her a couple of days later and said her urine culture showed infection so was prescribed a medication for that as well.   Kathy Burke did not know the name of that medication but Kathy Burke took it as instructed. 2. LOCATION: "Where is the  Odor located?" (e.g., inside/outside, left/right)     Vaginal odor. 3. ONSET: "When did the  Seen at [facility] on 01/20/2021  start?"     Bacterial Vaginosis was diagnosed. 4. PAIN: "Is there any pain?" If Yes, ask: "How bad is it?" (Scale: 1-10; mild, moderate, severe)     No abd pain or burning with urination.   No vaginal discharge. 5. ITCHING: "Is there any itching?" If Yes, ask: "How bad is it?" (Scale: 1-10; mild, moderate, severe)     No itching 6. CAUSE: "What do you think is causing the discharge?" "Have you had the same problem before? What happened then?"     *No Answer* 7. OTHER SYMPTOMS: "Do you have any other symptoms?" (e.g., fever, itching, vaginal bleeding, pain with urination, injury to genital area, vaginal foreign body)     No   Just the odor.   Her other symptoms have resolved. 8. PREGNANCY: "Is there  any chance you are pregnant?" "When was your last menstrual period?"     Not asked  Protocols used: VAGINAL [facility]

## 2021-02-07 ENCOUNTER — Ambulatory Visit (INDEPENDENT_AMBULATORY_CARE_PROVIDER_SITE_OTHER): Payer: No Typology Code available for payment source | Admitting: Physician Assistant

## 2021-02-07 ENCOUNTER — Encounter: Payer: Self-pay | Admitting: Physician Assistant

## 2021-02-07 ENCOUNTER — Other Ambulatory Visit (HOSPITAL_COMMUNITY)
Admission: RE | Admit: 2021-02-07 | Discharge: 2021-02-07 | Disposition: A | Discharge status: Home / Self Care | Payer: No Typology Code available for payment source | Source: Ambulatory Visit | Attending: Physician Assistant | Admitting: Physician Assistant

## 2021-02-07 ENCOUNTER — Other Ambulatory Visit: Payer: Self-pay

## 2021-02-07 VITALS — BP 120/69 | HR 97 | Temp 98.4°F | Wt 372.2 lb

## 2021-02-07 DIAGNOSIS — A749 Chlamydial infection, unspecified: Secondary | ICD-10-CM | POA: Diagnosis not present

## 2021-02-07 DIAGNOSIS — L750 Bromhidrosis: Secondary | ICD-10-CM | POA: Insufficient documentation

## 2021-02-07 DIAGNOSIS — N898 Other specified noninflammatory disorders of vagina: Secondary | ICD-10-CM

## 2021-02-07 LAB — POCT URINALYSIS DIPSTICK
Bilirubin, UA: NEGATIVE
Glucose, UA: NEGATIVE
Ketones, UA: NEGATIVE
Nitrite, UA: NEGATIVE
Protein, UA: NEGATIVE
Spec Grav, UA: 1.015 (ref 1.010–1.025)
Urobilinogen, UA: 2 E.U./dL — AB
pH, UA: 8 (ref 5.0–8.0)

## 2021-02-07 NOTE — Progress Notes (Signed)
Established patient visit   Patient: Kathy Burke   DOB: February 24, 2001   19 y.o. Female  MRN: [id number] Visit Date: 02/07/2021  Today's healthcare provider: Trey Sailors, PA-C   Chief Complaint  Patient presents with  . Vaginal Discharge  I,Xavyer Steenson M Christin Moline,acting as a scribe for Trey Sailors, PA-C.,have documented all relevant documentation on the behalf of Trey Sailors, PA-C,as directed by  Trey Sailors, PA-C while in the presence of Trey Sailors, PA-C.  Subjective    Vaginal Discharge The patient's primary symptoms include a genital odor, vaginal bleeding and vaginal discharge. This is a recurrent problem. The current episode started in the past 7 days. The problem occurs constantly. The problem has been unchanged. The patient is experiencing no pain. Associated symptoms include discolored urine. Pertinent negatives include no abdominal pain, back pain, dysuria, frequency or urgency. The vaginal discharge was bloody and brown. The vaginal bleeding is spotting. She has not been passing clots. She has not been passing tissue. Nothing aggravates the symptoms. She has tried nothing for the symptoms. The treatment provided no relief.    Patient reports a month of malodorous urine mostly when she uses the bathroom. She denies burning with urination. She denies dysuria. She denies frequency. She denies incontinence. She was treated for BV in 01/2021 from urgent care. She was treated with flagyl 500 mg BID x 7 days. She also had a urine test which showed 100,000 CFU of E coli which was treated with macrobid. She reports her symptoms have resolved since treatment. She is having occasional blood In her urine, she is unsure if this is from her urine stream or menstrual blood due to her having an IUD. Last period was a couple of years ago due to [place] but she will have irregular bleeding.       Medications: Outpatient Medications Prior to Visit  Medication Sig  . ibuprofen  (ADVIL) 800 MG tablet Take 1 tablet (800 mg total) by mouth 3 (three) times daily.  Marland Kitchen levonorgestrel (MIRENA) 20 MCG/24HR IUD 1 each by Intrauterine route once.  . loratadine (CLARITIN) 10 MG tablet Take by mouth.  . metroNIDAZOLE (FLAGYL) 500 MG tablet Take 1 tablet (500 mg total) by mouth 2 (two) times daily.  . nitrofurantoin, macrocrystal-monohydrate, (MACROBID) 100 MG capsule Take 1 capsule (100 mg total) by mouth 2 (two) times daily.   No facility-administered medications prior to visit.    Review of Systems  Gastrointestinal: Negative for abdominal pain.  Genitourinary: Positive for vaginal discharge. Negative for dysuria, frequency and urgency.  Musculoskeletal: Negative for back pain.       Objective    BP 120/69 (BP Location: Right Arm, Patient Position: Sitting, Cuff Size: Large)   Pulse 97   Temp 98.4 F (36.9 C) (Oral)   Wt (!) 372 lb 3.2 oz (168.8 kg)   SpO2 99%   BMI 51.91 kg/m     Physical Exam Constitutional:      Appearance: Normal appearance. She is obese.  Cardiovascular:     Rate and Rhythm: Normal rate and regular rhythm.     Heart sounds: Normal heart sounds.  Pulmonary:     Effort: Pulmonary effort is normal.     Breath sounds: Normal breath sounds.  Abdominal:     General: Bowel sounds are normal.     Palpations: Abdomen is soft.  Skin:    General: Skin is warm and dry.  Neurological:  General: No focal deficit present.     Mental Status: She is alert and oriented to person, place, and time.  Psychiatric:        Mood and Affect: Mood normal.        Behavior: Behavior normal.       Results for orders placed or performed in visit on 02/07/21  POCT Urinalysis Dipstick  Result Value Ref Range   Color, UA Yellow    Clarity, UA Cloudy    Glucose, UA Negative Negative   Bilirubin, UA Negative    Ketones, UA Negative    Spec Grav, UA 1.015 1.010 - 1.025   Blood, UA Hemolyzed: Large    pH, UA 8.0 5.0 - 8.0   Protein, UA Negative  Negative   Urobilinogen, UA 2.0 (A) 0.2 or 1.0 E.U./dL   Nitrite, UA Negative    Leukocytes, UA Small (1+) (A) Negative   Appearance     Odor      Assessment & Plan    1. Urinary body odor  Wait for labs to come back to direct treatment. Self swab received today.   - Cervicovaginal ancillary only - POCT Urinalysis Dipstick - CULTURE, URINE COMPREHENSIVE  2. Vaginal odor  - Cervicovaginal ancillary only   Return if symptoms worsen or fail to improve.      ITrey Sailors, PA-C, have reviewed all documentation for this visit. The documentation on 02/08/21 for the exam, diagnosis, procedures, and orders are all accurate and complete.  The entirety of the information documented in the History of Present Illness, Review of Systems and Physical Exam were personally obtained by me. Portions of this information were initially documented by [facility] and reviewed by me for thoroughness and accuracy.     Maryella Shivers  [facility] [phone] (phone) [phone] (fax)  [facility]

## 2021-02-08 NOTE — Patient Instructions (Signed)
Urinary Tract Infection, Adult  A urinary tract infection (UTI) is an infection of any part of the urinary tract. The urinary tract includes the kidneys, ureters, bladder, and urethra. These organs make, store, and get rid of urine in the body. An upper UTI affects the ureters and kidneys. A lower UTI affects the bladder and urethra. What are the causes? Most urinary tract infections are caused by bacteria in your genital area around your urethra, where urine leaves your body. These bacteria grow and cause inflammation of your urinary tract. What increases the risk? You are more likely to develop this condition if:  You have a urinary catheter that stays in place.  You are not able to control when you urinate or have a bowel movement (incontinence).  You are female and you: ? Use a spermicide or diaphragm for birth control. ? Have low estrogen levels. ? Are pregnant.  You have certain genes that increase your risk.  You are sexually active.  You take antibiotic medicines.  You have a condition that causes your flow of urine to slow down, such as: ? An enlarged prostate, if you are female. ? Blockage in your urethra. ? A kidney stone. ? A nerve condition that affects your bladder control (neurogenic bladder). ? Not getting enough to drink, or not urinating often.  You have certain medical conditions, such as: ? Diabetes. ? A weak disease-fighting system (immunesystem). ? Sickle cell disease. ? Gout. ? Spinal cord injury. What are the signs or symptoms? Symptoms of this condition include:  Needing to urinate right away (urgency).  Frequent urination. This may include small amounts of urine each time you urinate.  Pain or burning with urination.  Blood in the urine.  Urine that smells bad or unusual.  Trouble urinating.  Cloudy urine.  Vaginal discharge, if you are female.  Pain in the abdomen or the lower back. You may also have:  Vomiting or a decreased  appetite.  Confusion.  Irritability or tiredness.  A fever or chills.  Diarrhea. The first symptom in older adults may be confusion. In some cases, they may not have any symptoms until the infection has worsened. How is this diagnosed? This condition is diagnosed based on your medical history and a physical exam. You may also have other tests, including:  Urine tests.  Blood tests.  Tests for STIs (sexually transmitted infections). If you have had more than one UTI, a cystoscopy or imaging studies may be done to determine the cause of the infections. How is this treated? Treatment for this condition includes:  Antibiotic medicine.  Over-the-counter medicines to treat discomfort.  Drinking enough water to stay hydrated. If you have frequent infections or have other conditions such as a kidney stone, you may need to see a health care provider who specializes in the urinary tract (urologist). In rare cases, urinary tract infections can cause sepsis. Sepsis is a life-threatening condition that occurs when the body responds to an infection. Sepsis is treated in the hospital with IV antibiotics, fluids, and other medicines. Follow these instructions at home: Medicines  Take over-the-counter and prescription medicines only as told by your health care provider.  If you were prescribed an antibiotic medicine, take it as told by your health care provider. Do not stop using the antibiotic even if you start to feel better. General instructions  Make sure you: ? Empty your bladder often and completely. Do not hold urine for long periods of time. ? Empty your bladder after   sex. ? Wipe from front to back after urinating or having a bowel movement if you are female. Use each tissue only one time when you wipe.  Drink enough fluid to keep your urine pale yellow.  Keep all follow-up visits. This is important.   Contact a health care provider if:  Your symptoms do not get better after 1-2  days.  Your symptoms go away and then return. Get help right away if:  You have severe pain in your back or your lower abdomen.  You have a fever or chills.  You have nausea or vomiting. Summary  A urinary tract infection (UTI) is an infection of any part of the urinary tract, which includes the kidneys, ureters, bladder, and urethra.  Most urinary tract infections are caused by bacteria in your genital area.  Treatment for this condition often includes antibiotic medicines.  If you were prescribed an antibiotic medicine, take it as told by your health care provider. Do not stop using the antibiotic even if you start to feel better.  Keep all follow-up visits. This is important. This information is not intended to replace advice given to you by your health care provider. Make sure you discuss any questions you have with your health care provider. Document Revised: 07/01/2020 Document Reviewed: 07/01/2020 Elsevier Patient Education  2021 Elsevier Inc.  

## 2021-02-09 ENCOUNTER — Telehealth: Payer: Self-pay

## 2021-02-09 ENCOUNTER — Other Ambulatory Visit: Payer: Self-pay | Admitting: Physician Assistant

## 2021-02-09 LAB — CERVICOVAGINAL ANCILLARY ONLY
Bacterial Vaginitis (gardnerella): NEGATIVE
Candida Glabrata: NEGATIVE
Candida Vaginitis: NEGATIVE
Chlamydia: POSITIVE — AB
Comment: NEGATIVE
Comment: NEGATIVE
Comment: NEGATIVE
Comment: NEGATIVE
Comment: NEGATIVE
Comment: NORMAL
Neisseria Gonorrhea: NEGATIVE
Trichomonas: NEGATIVE

## 2021-02-09 MED ORDER — AZITHROMYCIN 500 MG PO TABS
1000.0000 mg | ORAL_TABLET | Freq: Once | ORAL | 0 refills | Status: DC
Start: 2021-02-09 — End: 2021-02-09

## 2021-02-09 NOTE — Telephone Encounter (Signed)
-----   Message from Trey Sailors, [place] sent at 02/09/2021 12:53 PM EST ----- Kathy Burke,   Your vaginal swab is positive for chlamydia. I will send you in the treatment which is 1 g of azithromycin taken one time. This will come to the pharmacy as two pills that you should take at once. This is a sexually transmitted infection so your sexual partners should be treated and you should wait one week after treatment before engaging in sexual activitu.   Best, Osvaldo Angst, PA-C

## 2021-02-09 NOTE — Addendum Note (Signed)
Addended by: Trey Sailors on: 02/09/2021 01:01 PM   Modules accepted: Orders

## 2021-02-09 NOTE — Telephone Encounter (Signed)
I called pt and pt verbalized understanding of information below.  

## 2021-02-09 NOTE — Progress Notes (Signed)
Form was submitted.

## 2021-02-10 LAB — CULTURE, URINE COMPREHENSIVE

## 2021-02-10 LAB — SPECIMEN STATUS REPORT

## 2021-02-16 ENCOUNTER — Other Ambulatory Visit: Payer: Self-pay

## 2021-02-16 ENCOUNTER — Other Ambulatory Visit: Payer: Self-pay | Admitting: Emergency Medicine

## 2021-02-16 ENCOUNTER — Emergency Department
Admission: EM | Admit: 2021-02-16 | Discharge: 2021-02-16 | Disposition: A | Discharge status: Home / Self Care | Payer: Worker's Compensation | Attending: Emergency Medicine | Admitting: Emergency Medicine

## 2021-02-16 ENCOUNTER — Encounter: Payer: Self-pay | Admitting: Emergency Medicine

## 2021-02-16 DIAGNOSIS — T23161A Burn of first degree of back of right hand, initial encounter: Secondary | ICD-10-CM | POA: Diagnosis present

## 2021-02-16 DIAGNOSIS — Y990 Civilian activity done for income or pay: Secondary | ICD-10-CM | POA: Diagnosis not present

## 2021-02-16 DIAGNOSIS — X102XXA Contact with fats and cooking oils, initial encounter: Secondary | ICD-10-CM | POA: Insufficient documentation

## 2021-02-16 DIAGNOSIS — Y9289 Other specified places as the place of occurrence of the external cause: Secondary | ICD-10-CM | POA: Diagnosis not present

## 2021-02-16 DIAGNOSIS — Y9389 Activity, other specified: Secondary | ICD-10-CM | POA: Diagnosis not present

## 2021-02-16 MED ORDER — SILVER SULFADIAZINE 1 % EX CREA: 1.0000 "application " | TOPICAL_CREAM | Freq: Every day | CUTANEOUS | 0 refills | Status: DC

## 2021-02-16 NOTE — ED Notes (Signed)
Spoke with Kathy Burke [facility]) and she requested only a UA Drug Screen at this time.

## 2021-02-16 NOTE — ED Triage Notes (Signed)
Pt burned on right hand and right forearm by grease at work.

## 2021-02-16 NOTE — Discharge Instructions (Signed)
Apply silvadene once daily.

## 2021-02-16 NOTE — ED Provider Notes (Signed)
[facility]-EMERGENCY DEPARTMENT  ____________________________________________  Time seen: Approximately 7:26 PM  I have reviewed the triage vital signs and the nursing notes.   HISTORY  Chief Complaint Burn   Historian Patient     HPI Kathy Burke is a 20 y.o. female presents to the emergency department with first-degree burns along the dorsal aspect of the right hand after patient was splashed with grease at work.  Patient states that incident occurred earlier today.  She has been able to actively move hand since injury occurred.  No other alleviating measures have been attempted.   History reviewed. No pertinent past medical history.   Immunizations up to date:  Yes.     History reviewed. No pertinent past medical history.  Patient Active Problem List   Diagnosis Date Noted  . Irregular menses 03/05/2018  . ADD (attention deficit disorder) 04/05/2015  . Allergic rhinitis 04/05/2015  . Arthritis 04/05/2015  . Dermatitis, eczematoid 04/05/2015  . Encounter for physical therapy 04/05/2015  . Family history of diabetes mellitus type II 04/05/2015  . Blood glucose elevated 04/05/2015  . Cannot sleep 04/05/2015  . Anemia, iron deficiency 04/05/2015  . Adiposity 04/05/2015  . Anterior knee pain 04/05/2015    Past Surgical History:  Procedure Laterality Date  . pt denies    . TONSILLECTOMY  as a baby    Prior to Admission medications   Medication Sig Start Date End Date Taking? Authorizing Provider  silver sulfADIAZINE (SILVADENE) 1 % cream Apply 1 application topically daily for 7 days. 02/16/21 02/23/21 Yes Pia Mau M, PA-C  ibuprofen (ADVIL) 800 MG tablet Take 1 tablet (800 mg total) by mouth 3 (three) times daily. 12/05/20   Malva Limes, MD  levonorgestrel (MIRENA) 20 MCG/24HR IUD 1 each by Intrauterine route once.    [provider]  loratadine (CLARITIN) 10 MG tablet Take by mouth.    [provider]  amphetamine-dextroamphetamine  (ADDERALL XR) 20 MG 24 hr capsule Take 1 capsule (20 mg total) by mouth daily. 08/13/19 01/20/21  [name], PA-C    Allergies Patient has no known allergies.  Family History  Problem Relation Age of Onset  . Hypertension Mother   . Hypertension Father   . Diabetes Maternal Grandmother   . Diabetes Paternal Grandmother     Social History Social History   Tobacco Use  . Smoking status: Never Smoker  . Smokeless tobacco: Never Used  Vaping Use  . Vaping Use: Never used  Substance Use Topics  . Alcohol use: No  . Drug use: No     Review of Systems  Constitutional: No fever/chills Eyes:  No discharge ENT: No upper respiratory complaints. Respiratory: no cough. No SOB/ use of accessory muscles to breath Gastrointestinal:   No nausea, no vomiting.  No diarrhea.  No constipation. Musculoskeletal: Negative for musculoskeletal pain. Skin: Patient has superficial, first-degree burn along right hand.    ____________________________________________   PHYSICAL EXAM:  VITAL SIGNS: ED Triage Vitals  Enc Vitals Group     BP 02/16/21 1821 (!) 125/93     Pulse Rate 02/16/21 1821 (!) 110     Resp 02/16/21 1821 18     Temp 02/16/21 1821 98.2 F (36.8 C)     Temp Source 02/16/21 1821 Oral     SpO2 02/16/21 1821 98 %     Weight 02/16/21 1815 (!) 360 lb (163.3 kg)     Height 02/16/21 1815 5\' 11"  (1.803 m)     Head Circumference --  Peak Flow --      Pain Score 02/16/21 1814 7     Pain Loc --      Pain Edu? --      Excl. in GC? --      Constitutional: Alert and oriented. Well appearing and in no acute distress. Eyes: Conjunctivae are normal. PERRL. EOMI. Head: Atraumatic. Cardiovascular: Normal rate, regular rhythm. Normal S1 and S2.  Good peripheral circulation. Respiratory: Normal respiratory effort without tachypnea or retractions. Lungs CTAB. Good air entry to the bases with no decreased or absent breath sounds Gastrointestinal: Bowel sounds x 4  quadrants. Soft and nontender to palpation. No guarding or rigidity. No distention. Musculoskeletal: Full range of motion to all extremities. No obvious deformities noted Neurologic:  Normal for age. No gross focal neurologic deficits are appreciated.  Skin: Patient has first-degree burn along the dorsal aspect of the right hand. Psychiatric: Mood and affect are normal for age. Speech and behavior are normal.   ____________________________________________   LABS (all labs ordered are listed, but only abnormal results are displayed)  Labs Reviewed - No data to display ____________________________________________  EKG   ____________________________________________  RADIOLOGY   No results found.  ____________________________________________    PROCEDURES  Procedure(s) performed:     Procedures     Medications - No data to display   ____________________________________________   INITIAL IMPRESSION / ASSESSMENT AND PLAN / ED COURSE  Pertinent labs & imaging results that were available during my care of the patient were reviewed by me and considered in my medical decision making (see chart for details).      Assessment and plan Burn 20 year old female presents to the emergency department after she was splashed with grease at work.  Patient has a superficial, first-degree burn along the dorsal aspect of the right hand.  She was discharged with Silvadene.  All patient questions were answered.     ____________________________________________  FINAL CLINICAL IMPRESSION(S) / ED DIAGNOSES  Final diagnoses:  Superficial burn of back of right hand, initial encounter      NEW MEDICATIONS STARTED DURING THIS VISIT:  ED Discharge Orders         Ordered    silver sulfADIAZINE (SILVADENE) 1 % cream  Daily        02/16/21 1924              This chart was dictated using voice recognition software/Dragon. Despite best efforts to proofread, errors can occur  which can change the meaning. Any change was purely unintentional.     Orvil Feil, PA-C 02/16/21 Serena Croissant    [place], MD 02/16/21 [phone]

## 2021-02-16 NOTE — ED Notes (Signed)
See triage note   Presents s/p burn  States she had hot grease splash back onto right arm  Min redness noted  No blisters noted

## 2021-02-28 ENCOUNTER — Ambulatory Visit: Payer: No Typology Code available for payment source | Admitting: Family Medicine

## 2021-03-08 ENCOUNTER — Other Ambulatory Visit: Payer: Self-pay

## 2021-03-08 ENCOUNTER — Ambulatory Visit
Admission: EM | Admit: 2021-03-08 | Discharge: 2021-03-08 | Disposition: A | Discharge status: Home / Self Care | Payer: No Typology Code available for payment source | Attending: Family Medicine | Admitting: Family Medicine

## 2021-03-08 ENCOUNTER — Telehealth: Payer: Self-pay | Admitting: Family Medicine

## 2021-03-08 DIAGNOSIS — B9689 Other specified bacterial agents as the cause of diseases classified elsewhere: Secondary | ICD-10-CM | POA: Insufficient documentation

## 2021-03-08 DIAGNOSIS — N760 Acute vaginitis: Secondary | ICD-10-CM | POA: Diagnosis not present

## 2021-03-08 LAB — WET PREP, GENITAL
Sperm: NONE SEEN
Trich, Wet Prep: NONE SEEN
Yeast Wet Prep HPF POC: NONE SEEN

## 2021-03-08 MED ORDER — METRONIDAZOLE 500 MG PO TABS
500.0000 mg | ORAL_TABLET | Freq: Two times a day (BID) | ORAL | 0 refills | Status: DC
Start: 2021-03-08 — End: 2021-04-06
  Filled 2021-03-08: qty 14, 7d supply, fill #0

## 2021-03-08 NOTE — Telephone Encounter (Signed)
error:[id number] ° °

## 2021-03-08 NOTE — ED Triage Notes (Signed)
Pt c/o vaginal odor and discharge for the past 2 weeks. Pt states she was recently treated for BV and may not have finished all her meds. Pt denies any unprotected sexual encounters and is not concerned about STDs. Pt denies urinary symptoms, f/n/v/d or other problems.

## 2021-03-08 NOTE — ED Provider Notes (Signed)
[facility]    CSN: [id number] Arrival date & time: 03/08/21  1814      History   Chief Complaint Chief Complaint  Patient presents with  . Vaginal Discharge   HPI  20 year old female presents with vaginal discharge.  Patient recently treated for bacterial vaginosis and for chlamydia.  She states that she is not concerned about STDs today.  She reports vaginal discharge and odor for the past 1.5 weeks.  No relieving factors.  No itching.  No medications or interventions tried.  Denies abdominal pain.  No urinary symptoms.  No other complaints.  Patient Active Problem List   Diagnosis Date Noted  . Irregular menses 03/05/2018  . ADD (attention deficit disorder) 04/05/2015  . Allergic rhinitis 04/05/2015  . Arthritis 04/05/2015  . Dermatitis, eczematoid 04/05/2015  . Encounter for physical therapy 04/05/2015  . Family history of diabetes mellitus type II 04/05/2015  . Blood glucose elevated 04/05/2015  . Cannot sleep 04/05/2015  . Anemia, iron deficiency 04/05/2015  . Adiposity 04/05/2015  . Anterior knee pain 04/05/2015   Past Surgical History:  Procedure Laterality Date  . pt denies    . TONSILLECTOMY  as a baby    OB History   No obstetric history on file.     Home Medications    Prior to Admission medications   Medication Sig Start Date End Date Taking? Authorizing Provider  levonorgestrel (MIRENA) 20 MCG/24HR IUD 1 each by Intrauterine route once.   Yes [provider]  metroNIDAZOLE (FLAGYL) 500 MG tablet Take 1 tablet (500 mg total) by mouth 2 (two) times daily. 03/08/21  Yes Steffany Schoenfelder G, DO  amphetamine-dextroamphetamine (ADDERALL XR) 20 MG 24 hr capsule Take 1 capsule (20 mg total) by mouth daily. 08/13/19 01/20/21  [name], PA-C  loratadine (CLARITIN) 10 MG tablet Take by mouth.  03/08/21  [provider]    Family History Family History  Problem Relation Age of Onset  . Hypertension Mother   . Hypertension  Father   . Diabetes Maternal Grandmother   . Diabetes Paternal Grandmother     Social History Social History   Tobacco Use  . Smoking status: Never Smoker  . Smokeless tobacco: Never Used  Vaping Use  . Vaping Use: Never used  Substance Use Topics  . Alcohol use: No  . Drug use: No     Allergies   Patient has no known allergies.   Review of Systems Review of Systems  Constitutional: Negative.   Genitourinary: Positive for vaginal discharge.       Vaginal odor.   Physical Exam Triage Vital Signs ED Triage Vitals  Enc Vitals Group     BP 03/08/21 1938 129/71     Pulse Rate 03/08/21 1938 94     Resp 03/08/21 1938 18     Temp 03/08/21 1938 98.3 F (36.8 C)     Temp Source 03/08/21 1938 Oral     SpO2 03/08/21 1938 100 %     Weight 03/08/21 1936 (!) 360 lb (163.3 kg)     Height 03/08/21 1936 5\' 11"  (1.803 m)     Head Circumference --      Peak Flow --      Pain Score 03/08/21 1936 0     Pain Loc --      Pain Edu? --      Excl. in GC? --    Updated Vital Signs BP 129/71 (BP Location: Left Arm)   Pulse 94  Temp 98.3 F (36.8 C) (Oral)   Resp 18   Ht 5\' 11"  (1.803 m)   Wt (!) 163.3 kg   SpO2 100%   BMI 50.21 kg/m   Visual Acuity Right Eye Distance:   Left Eye Distance:   Bilateral Distance:    Right Eye Near:   Left Eye Near:    Bilateral Near:     Physical Exam Constitutional:      General: She is not in acute distress.    Appearance: Normal appearance. She is obese.  HENT:     Head: Normocephalic and atraumatic.  Cardiovascular:     Rate and Rhythm: Normal rate and regular rhythm.  Pulmonary:     Effort: Pulmonary effort is normal.     Breath sounds: Normal breath sounds. No wheezing, rhonchi or rales.  Abdominal:     General: There is no distension.     Palpations: Abdomen is soft.     Tenderness: There is no abdominal tenderness.  Neurological:     Mental Status: She is alert.  Psychiatric:        Mood and Affect: Mood normal.         Behavior: Behavior normal.      UC Treatments / Results  Labs (all labs ordered are listed, but only abnormal results are displayed) Labs Reviewed  WET PREP, GENITAL - Abnormal; Notable for the following components:      Result Value   Clue Cells Wet Prep HPF POC PRESENT (*)    WBC, Wet Prep HPF POC MODERATE (*)    All other components within normal limits    EKG   Radiology No results found.  Procedures Procedures (including critical care time)  Medications Ordered in UC Medications - No data to display  Initial Impression / Assessment and Plan / UC Course  I have reviewed the triage vital signs and the nursing notes.  Pertinent labs & imaging results that were available during my care of the patient were reviewed by me and considered in my medical decision making (see chart for details).    20 year old female presents with bacterial vaginosis.  Treating with Flagyl.   Final Clinical Impressions(s) / UC Diagnoses   Final diagnoses:  Bacterial vaginosis   Discharge Instructions   None    ED Prescriptions    Medication Sig Dispense Auth. Provider   metroNIDAZOLE (FLAGYL) 500 MG tablet Take 1 tablet (500 mg total) by mouth 2 (two) times daily. 14 tablet 12, DO     PDMP not reviewed this encounter.   Tommie Sams, Tommie Sams 03/08/21 2035

## 2021-03-09 ENCOUNTER — Other Ambulatory Visit: Payer: Self-pay

## 2021-04-04 ENCOUNTER — Telehealth: Payer: Self-pay

## 2021-04-04 DIAGNOSIS — Z30432 Encounter for removal of intrauterine contraceptive device: Secondary | ICD-10-CM

## 2021-04-04 NOTE — Telephone Encounter (Signed)
Patient has an IUD and would like to have it removed.    Please place a referral for her with an OB/GYN ASAP.

## 2021-04-04 NOTE — Telephone Encounter (Signed)
Referred.

## 2021-04-06 ENCOUNTER — Ambulatory Visit (INDEPENDENT_AMBULATORY_CARE_PROVIDER_SITE_OTHER): Payer: No Typology Code available for payment source | Admitting: Advanced Practice Midwife

## 2021-04-06 ENCOUNTER — Other Ambulatory Visit: Payer: Self-pay

## 2021-04-06 ENCOUNTER — Encounter: Payer: Self-pay | Admitting: Advanced Practice Midwife

## 2021-04-06 DIAGNOSIS — Z30432 Encounter for removal of intrauterine contraceptive device: Secondary | ICD-10-CM | POA: Diagnosis not present

## 2021-04-06 NOTE — Progress Notes (Signed)
    GYNECOLOGY OFFICE PROCEDURE NOTE  Kathy Burke is a 20 y.o. No obstetric history on file. here for IUD removal. The patient currently has a Mirena IUD placed 3 or 4 years ago. She has monthly spotting. She no longer wants the IUD and she does not want any other birth control at this time. She has a history of irregular cycles. She understands condoms are recommended to prevent STDs.    Review of Systems  Constitutional: Negative for chills and fever.  HENT: Negative for congestion, ear discharge, ear pain, hearing loss, sinus pain and sore throat.   Eyes: Negative for blurred vision and double vision.  Respiratory: Negative for cough, shortness of breath and wheezing.   Cardiovascular: Negative for chest pain, palpitations and leg swelling.  Gastrointestinal: Negative for abdominal pain, blood in stool, constipation, diarrhea, heartburn, melena, nausea and vomiting.  Genitourinary: Negative for dysuria, flank pain, frequency, hematuria and urgency.  Musculoskeletal: Negative for back pain, joint pain and myalgias.  Skin: Negative for itching and rash.  Neurological: Negative for dizziness, tingling, tremors, sensory change, speech change, focal weakness, seizures, loss of consciousness, weakness and headaches.  Endo/Heme/Allergies: Negative for environmental allergies. Does not bruise/bleed easily.  Psychiatric/Behavioral: Negative for depression, hallucinations, memory loss, substance abuse and suicidal ideas. The patient is not nervous/anxious and does not have insomnia.     Vital Signs: BP 122/78   Ht 5\' 11"  (1.803 m)   Wt (!) 374 lb (169.6 kg)   BMI 52.16 kg/m  Constitutional: Well nourished, well developed female in no acute distress.  HEENT: normal Skin: Warm and dry.  Cardiovascular: Regular rate and rhythm.   Respiratory: Clear to auscultation bilateral. Normal respiratory effort Neuro: DTRs 2+, Cranial nerves grossly intact Psych: Alert and Oriented x3. No memory  deficits. Normal mood and affect.  MS: normal gait, normal bilateral lower extremity ROM/strength/stability.  Pelvic exam:  is limited by body habitus EGBUS: within normal limits Vagina: within normal limits and with normal mucosa  Cervix: normal appearance   IUD Removal  Patient identified, informed consent performed, consent signed.   Discussed risks of irregular bleeding, cramping, infection, malpositioning or uterine perforation of the IUD which may require further procedures. Time out was performed. Speculum placed in the vagina. The strings of the IUD were grasped and pulled using ring forceps. The IUD was successfully removed in its entirety.  Patient tolerated procedure well.    , CNM [facility] 04/06/2021, 10:38 AM

## 2021-04-24 ENCOUNTER — Ambulatory Visit: Payer: No Typology Code available for payment source | Admitting: Family Medicine

## 2021-08-22 ENCOUNTER — Other Ambulatory Visit: Payer: Self-pay

## 2021-08-22 ENCOUNTER — Ambulatory Visit
Admission: EM | Admit: 2021-08-22 | Discharge: 2021-08-22 | Disposition: A | Discharge status: Home / Self Care | Payer: No Typology Code available for payment source | Attending: Emergency Medicine | Admitting: Emergency Medicine

## 2021-08-22 DIAGNOSIS — J029 Acute pharyngitis, unspecified: Secondary | ICD-10-CM | POA: Insufficient documentation

## 2021-08-22 DIAGNOSIS — R509 Fever, unspecified: Secondary | ICD-10-CM | POA: Diagnosis not present

## 2021-08-22 DIAGNOSIS — Z20822 Contact with and (suspected) exposure to covid-19: Secondary | ICD-10-CM | POA: Diagnosis not present

## 2021-08-22 LAB — RESP PANEL BY RT-PCR (FLU A&B, COVID) ARPGX2
Influenza A by PCR: NEGATIVE
Influenza B by PCR: NEGATIVE
SARS Coronavirus 2 by RT PCR: NEGATIVE

## 2021-08-22 LAB — POCT RAPID STREP A: Streptococcus, Group A Screen (Direct): NEGATIVE

## 2021-08-22 MED ORDER — IBUPROFEN 800 MG PO TABS
800.0000 mg | ORAL_TABLET | Freq: Once | ORAL | Status: AC
  Administered 2021-08-22: 800 mg via ORAL

## 2021-08-22 MED ORDER — BENZONATATE 200 MG PO CAPS: 200.0000 mg | ORAL_CAPSULE | Freq: Three times a day (TID) | ORAL | 0 refills | Status: DC | PRN

## 2021-08-22 MED ORDER — IBUPROFEN 600 MG PO TABS: 600.0000 mg | ORAL_TABLET | Freq: Four times a day (QID) | ORAL | 0 refills | Status: DC | PRN

## 2021-08-22 MED ORDER — ACETAMINOPHEN 500 MG PO TABS
1000.0000 mg | ORAL_TABLET | Freq: Once | ORAL | Status: AC
  Administered 2021-08-22: 1000 mg via ORAL

## 2021-08-22 MED ORDER — ONDANSETRON 8 MG PO TBDP: 8.0000 mg | ORAL_TABLET | Freq: Three times a day (TID) | ORAL | 0 refills | Status: DC | PRN

## 2021-08-22 NOTE — ED Triage Notes (Signed)
Pt c/o sore throat since Sunday. Pt is concerned about strep throat. Pt also reports headache, body aches, nasal congestion and cough with hot/cold flashes. Pt also reports nausea and diarrhea, denies fever.

## 2021-08-22 NOTE — Discharge Instructions (Addendum)
Your COVID, flu PCR was negative. your rapid strep was negative today, so we have sent off a throat culture.  We will contact you and call in the appropriate antibiotics if your culture comes back positive for an infection requiring antibiotic treatment.  Give [place] a working phone number.  If you were given a prescription for antibiotics, you may want to wait and fill it until you know the results of the culture.  1 gram of Tylenol and 600 mg ibuprofen together 3-4 times a day as needed for pain.  Make sure you drink plenty of extra fluids.  Some people find salt water gargles and  Traditional Medicinal's "Throat Coat" tea helpful. Take 5 mL of liquid Benadryl and 5 mL of Maalox. Mix it together, and then hold it in your mouth for as long as you can and then swallow. You may do this 4 times a day.  Tessalon for the cough, Zofran for nausea and vomiting.  Push extra electrolyte containing fluids such as Pedialyte or Gatorade until your urine is clear.  Go to www.goodrx.com  or www.costplusdrugs.com to look up your medications. This will give you a list of where you can find your prescriptions at the most affordable prices. Or ask the pharmacist what the cash price is, or if they have any other discount programs available to help make your medication more affordable. This can be less expensive than what you would pay with insurance.

## 2021-08-22 NOTE — ED Provider Notes (Signed)
HPI  SUBJECTIVE:  Patient reports sore throat starting 2 days ago.  No aggravating factors.  She has been taking vitamins, Tylenol, Alka-Seltzer cold with improvement in her symptoms. + Fever tmax 101.7 here   No neck stiffness  + Cough + nasal congestion, rhinorrhea + Headache No Rash  +  loss of taste and smell + shortness of breath, wheezing No nausea, but 2 episodes of vomiting No diarrhea No abdominal pain     No Recent Strep, COVID, flu exposure No reflux sxs No Allergy sxs  No Breathing difficulty, voice changes, sensation of throat swelling shut No Drooling No Trismus No abx in past month.  No antipyretic in past 4-6 hrs  She got the second dose of the COVID-vaccine. Past medical history negative for asthma, frequent strep. LMP: 3 to 4 months ago.  Denies the possibility of being pregnant PMD: Malva Limes, MD    History reviewed. No pertinent past medical history.  Past Surgical History:  Procedure Laterality Date   pt denies     TONSILLECTOMY  as a baby    Family History  Problem Relation Age of Onset   Hypertension Mother    Hypertension Father    Diabetes Maternal Grandmother    Diabetes Paternal Grandmother     Social History   Tobacco Use   Smoking status: Never   Smokeless tobacco: Never  Vaping Use   Vaping Use: Never used  Substance Use Topics   Alcohol use: No   Drug use: No    No current facility-administered medications for this encounter.  Current Outpatient Medications:    benzonatate (TESSALON) 200 MG capsule, Take 1 capsule (200 mg total) by mouth 3 (three) times daily as needed for cough., Disp: 30 capsule, Rfl: 0   ibuprofen (ADVIL) 600 MG tablet, Take 1 tablet (600 mg total) by mouth every 6 (six) hours as needed., Disp: 30 tablet, Rfl: 0   ondansetron (ZOFRAN ODT) 8 MG disintegrating tablet, Take 1 tablet (8 mg total) by mouth every 8 (eight) hours as needed for nausea or vomiting., Disp: 20 tablet, Rfl: 0  No  Known Allergies   ROS  As noted in HPI.   Physical Exam  BP 121/79 (BP Location: Left Arm)   Pulse (!) 131   Temp (!) 101.7 F (38.7 C) (Oral)   Resp 18   Ht 5\' 11"  (1.803 m)   Wt (!) 142.9 kg   SpO2 97%   BMI 43.93 kg/m   Constitutional: Well developed, well nourished, no acute distress Eyes:  EOMI, conjunctiva normal bilaterally HENT: Normocephalic, atraumatic,mucus membranes moist.  No nasal congestion, normal turbinates.  Positive mild maxillary, frontal sinus tenderness.  Erythematous oropharynx, tonsils normal size.  Positive exudate.  Uvula midline.  Unable to completely visualize posterior oropharynx. Lungs: Lungs clear bilaterally, good air movement. Cardiovascular: Regular tachycardia no murmurs, rubs, gallops GI: nondistended, nontender. No appreciable splenomegaly skin: No rash, skin intact Lymph: + Anterior cervical LN.  No posterior cervical lymphadenopathy Musculoskeletal: no deformities Neurologic: Alert & oriented x 3, no focal neuro deficits Psychiatric: Speech and behavior appropriate.   ED Course   Medications  acetaminophen (TYLENOL) tablet 1,000 mg (1,000 mg Oral Given 08/22/21 1711)  ibuprofen (ADVIL) tablet 800 mg (800 mg Oral Given 08/22/21 1711)    Orders Placed This Encounter  Procedures   Resp Panel by RT-PCR (Flu A&B, Covid) Nasopharyngeal Swab    Standing Status:   Standing    Number of Occurrences:   1  Order Specific Question:   Is this test for diagnosis or screening    Answer:   Diagnosis of ill patient    Order Specific Question:   Symptomatic for COVID-19 as defined by CDC    Answer:   Yes    Order Specific Question:   Date of Symptom Onset    Answer:   08/20/2021    Order Specific Question:   Hospitalized for COVID-19    Answer:   No    Order Specific Question:   Admitted to ICU for COVID-19    Answer:   No    Order Specific Question:   Previously tested for COVID-19    Answer:   No    Order Specific Question:   Resident in  a congregate (group) care setting    Answer:   No    Order Specific Question:   Employed in healthcare setting    Answer:   No    Order Specific Question:   Pregnant    Answer:   No    Order Specific Question:   Has patient completed COVID vaccination(s) (2 doses of Pfizer/Moderna 1 dose of [employer])    Answer:   Yes    Order Specific Question:   Has patient completed COVID Booster / 3rd dose    Answer:   Unknown   Culture, group A strep    Standing Status:   Standing    Number of Occurrences:   1   Airborne and Contact precautions    Standing Status:   Standing    Number of Occurrences:   1   After Hours POC Rapid Strep A    Standing Status:   Standing    Number of Occurrences:   1   POCT rapid strep A    Standing Status:   Standing    Number of Occurrences:   1    Results for orders placed or performed during the hospital encounter of 08/22/21 (from the past 24 hour(s))  Resp Panel by RT-PCR (Flu A&B, Covid) Nasopharyngeal Swab     Status: None   Collection Time: 08/22/21  3:54 PM   Specimen: Nasopharyngeal Swab; Nasopharyngeal(NP) swabs in vial transport medium  Result Value Ref Range   SARS Coronavirus 2 by RT PCR NEGATIVE NEGATIVE   Influenza A by PCR NEGATIVE NEGATIVE   Influenza B by PCR NEGATIVE NEGATIVE  POCT rapid strep A     Status: None   Collection Time: 08/22/21  5:05 PM  Result Value Ref Range   Streptococcus, Group A Screen (Direct) NEGATIVE NEGATIVE   No results found.  ED Clinical Impression  1. Exudative pharyngitis   2. Febrile illness      ED Assessment/Plan  Flu, COVID PCR negative.   Patient with an exudative pharyngitis.  She has mild sinus tenderness, but no nasal congestion or evidence of obstruction.  Doubt sinusitis.  Did not check mono due to high rate of false negatives early on in the illness.  Rapid strep negative. Obtaining throat culture to guide antibiotic treatment. Discussed this with patient.  We'll contact ther if  culture is positive, and will call in Appropriate antibiotics. Patient home with ibuprofen, Tylenol, Benadryl/Maalox mixture, Tessalon, Zofran, push fluids, Mucinex D work note for 2 to 3 days.  Follow-up with PMD as necessary.  ER return precautions given.   Discussed labs,  MDM, plan and followup with patient. Discussed sn/sx that should prompt return to the ED. patient agrees with plan.  Meds ordered this encounter  Medications   acetaminophen (TYLENOL) tablet 1,000 mg   ibuprofen (ADVIL) tablet 800 mg   benzonatate (TESSALON) 200 MG capsule    Sig: Take 1 capsule (200 mg total) by mouth 3 (three) times daily as needed for cough.    Dispense:  30 capsule    Refill:  0   ibuprofen (ADVIL) 600 MG tablet    Sig: Take 1 tablet (600 mg total) by mouth every 6 (six) hours as needed.    Dispense:  30 tablet    Refill:  0   ondansetron (ZOFRAN ODT) 8 MG disintegrating tablet    Sig: Take 1 tablet (8 mg total) by mouth every 8 (eight) hours as needed for nausea or vomiting.    Dispense:  20 tablet    Refill:  0     *This clinic note was created using Scientist, clinical (histocompatibility and immunogenetics). Therefore, there may be occasional mistakes despite careful proofreading.     Domenick Gong, MD 08/23/21 1757

## 2021-08-25 LAB — CULTURE, GROUP A STREP (THRC)

## 2021-09-18 ENCOUNTER — Ambulatory Visit
Admission: EM | Admit: 2021-09-18 | Discharge: 2021-09-18 | Disposition: A | Discharge status: Home / Self Care | Payer: No Typology Code available for payment source | Attending: Nurse Practitioner | Admitting: Nurse Practitioner

## 2021-09-18 ENCOUNTER — Other Ambulatory Visit: Payer: Self-pay

## 2021-09-18 DIAGNOSIS — S161XXA Strain of muscle, fascia and tendon at neck level, initial encounter: Secondary | ICD-10-CM

## 2021-09-18 DIAGNOSIS — S39012A Strain of muscle, fascia and tendon of lower back, initial encounter: Secondary | ICD-10-CM

## 2021-09-18 DIAGNOSIS — S39011A Strain of muscle, fascia and tendon of abdomen, initial encounter: Secondary | ICD-10-CM | POA: Diagnosis not present

## 2021-09-18 MED ORDER — METHOCARBAMOL 500 MG PO TABS
500.0000 mg | ORAL_TABLET | Freq: Three times a day (TID) | ORAL | 0 refills | Status: DC | PRN
  Filled 2021-09-18: qty 20, 7d supply, fill #0

## 2021-09-18 MED ORDER — NAPROXEN 500 MG PO TABS
500.0000 mg | ORAL_TABLET | Freq: Two times a day (BID) | ORAL | 0 refills | Status: DC | PRN
  Filled 2021-09-18: qty 30, 15d supply, fill #0

## 2021-09-18 MED ORDER — METHYLPREDNISOLONE SODIUM SUCC 40 MG IJ SOLR
80.0000 mg | Freq: Once | INTRAMUSCULAR | Status: AC
  Administered 2021-09-18: 80 mg via INTRAMUSCULAR
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
## 2021-11-21 ENCOUNTER — Ambulatory Visit
Admission: EM | Admit: 2021-11-21 | Discharge: 2021-11-21 | Disposition: A | Discharge status: Home / Self Care | Payer: No Typology Code available for payment source | Attending: Internal Medicine | Admitting: Internal Medicine

## 2021-11-21 ENCOUNTER — Other Ambulatory Visit: Payer: Self-pay

## 2021-11-21 DIAGNOSIS — Z20822 Contact with and (suspected) exposure to covid-19: Secondary | ICD-10-CM | POA: Diagnosis not present

## 2021-11-21 DIAGNOSIS — J069 Acute upper respiratory infection, unspecified: Secondary | ICD-10-CM | POA: Diagnosis not present

## 2021-11-21 DIAGNOSIS — F909 Attention-deficit hyperactivity disorder, unspecified type: Secondary | ICD-10-CM | POA: Insufficient documentation

## 2021-11-21 DIAGNOSIS — J029 Acute pharyngitis, unspecified: Secondary | ICD-10-CM | POA: Insufficient documentation

## 2021-11-21 DIAGNOSIS — R197 Diarrhea, unspecified: Secondary | ICD-10-CM | POA: Insufficient documentation

## 2021-11-21 DIAGNOSIS — R059 Cough, unspecified: Secondary | ICD-10-CM | POA: Diagnosis present

## 2021-11-21 LAB — RESP PANEL BY RT-PCR (FLU A&B, COVID) ARPGX2
Influenza A by PCR: NEGATIVE
Influenza B by PCR: NEGATIVE
SARS Coronavirus 2 by RT PCR: NEGATIVE

## 2021-11-21 NOTE — ED Provider Notes (Signed)
[facility]    CSN: [id number] Arrival date & time: 11/21/21  1432      History   Chief Complaint Chief Complaint  Patient presents with   Cough   Diarrhea    HPI Kathy Burke is a 20 y.o. female.   20 year old female presents with nasal congestion, headache, cough, chills for the past 2 to 3 days.  Also vomited 2 days ago and still slightly nauseous.  Having occasional diarrhea and sore throat.  No fever.  Has taken OTC cough medications with some relief.  Requests rapid flu and COVID testing today.  Has history of ADHD and environmental allergies.  Takes no daily medication.   The history is provided by the patient.   History reviewed. No pertinent past medical history.  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      Home Medications    Prior to Admission medications   Medication Sig Start Date End Date Taking? Authorizing Provider  amphetamine-dextroamphetamine (ADDERALL XR) 20 MG 24 hr capsule Take 1 capsule (20 mg total) by mouth daily. 08/13/19 01/20/21  [name], PA-C  loratadine (CLARITIN) 10 MG tablet Take by mouth.  03/08/21  [provider]    Family History Family History  Problem Relation Age of Onset   Hypertension Mother    Hypertension Father    Diabetes Maternal Grandmother    Diabetes Paternal Grandmother     Social History Social History   Tobacco Use   Smoking status: Never   Smokeless tobacco: Never  Vaping Use   Vaping Use: Never used  Substance Use Topics   Alcohol use: No   Drug use: No      Allergies   Patient has no known allergies.   Review of Systems Review of Systems  Constitutional:  Positive for activity change, appetite change, chills and fatigue. Negative for fever.  HENT:  Positive for congestion, postnasal drip, sinus pressure and sore throat. Negative for ear discharge, ear pain, facial swelling, hearing loss, mouth sores, nosebleeds and trouble swallowing.   Eyes:  Negative for pain, discharge, redness and itching.  Respiratory:  Positive for cough. Negative for chest tightness and shortness of breath.   Gastrointestinal:  Positive for diarrhea, nausea and vomiting (last vomited 2 days ago.).  Musculoskeletal:  Positive for arthralgias and myalgias. Negative for neck pain and neck stiffness.  Skin:  Negative for color change and rash.  Allergic/Immunologic: Positive for environmental allergies. Negative for food allergies and immunocompromised state.  Neurological:  Positive for headaches. Negative for dizziness, tremors, seizures, syncope and speech difficulty.  Hematological:  Negative for adenopathy. Does not bruise/bleed easily.    Physical Exam Triage Vital Signs ED Triage Vitals  Enc Vitals Group     BP 11/21/21 1558 118/63     Pulse Rate 11/21/21 1558 98     Resp 11/21/21 1558 18     Temp 11/21/21 1558 98.7 F (37.1 C)     Temp Source 11/21/21 1558 Oral     SpO2 11/21/21 1558  98 %     Weight 11/21/21 1554 (!) 350 lb (158.8 kg)     Height 11/21/21 1554 5\' 11"  (1.803 m)     Head Circumference --      Peak Flow --      Pain Score 11/21/21 1554 7     Pain Loc --      Pain Edu? --      Excl. in GC? --    No data found.  Updated Vital Signs BP 118/63 (BP Location: Left Arm)    Pulse 98    Temp 98.7 F (37.1 C) (Oral)    Resp 18    Ht 5\' 11"  (1.803 m)    Wt (!) 350 lb (158.8 kg)    LMP 10/22/2021    SpO2 98%    BMI 48.82 kg/m   Visual Acuity Right Eye Distance:   Left Eye Distance:   Bilateral Distance:    Right Eye Near:   Left Eye  Near:    Bilateral Near:     Physical Exam Vitals and nursing note reviewed.  Constitutional:      General: She is awake. She is not in acute distress.    Appearance: She is well-developed and well-groomed. She is ill-appearing.     Comments: She is sitting on the exam table in no acute distress but appears tired and ill.   HENT:     Head: Normocephalic and atraumatic.     Right Ear: Hearing, ear canal and external ear normal. Tympanic membrane is bulging. Tympanic membrane is not injected or erythematous.     Left Ear: Hearing, ear canal and external ear normal. Tympanic membrane is bulging. Tympanic membrane is not injected or erythematous.     Nose: Congestion present.     Right Sinus: No maxillary sinus tenderness or frontal sinus tenderness.     Left Sinus: No maxillary sinus tenderness or frontal sinus tenderness.     Mouth/Throat:     Lips: Pink.     Mouth: Mucous membranes are moist.     Pharynx: Uvula midline. Posterior oropharyngeal erythema present. No pharyngeal swelling, oropharyngeal exudate or uvula swelling.  Eyes:     Extraocular Movements: Extraocular movements intact.     Conjunctiva/sclera: Conjunctivae normal.  Cardiovascular:     Rate and Rhythm: Normal rate and regular rhythm.     Heart sounds: Normal heart sounds. No murmur heard. Pulmonary:     Effort: Pulmonary effort is normal. No respiratory distress.     Breath sounds: Normal breath sounds and air entry. No decreased air movement. No decreased breath sounds, wheezing, rhonchi or rales.  Musculoskeletal:     Cervical back: Normal range of motion and neck supple.  Lymphadenopathy:     Cervical: No cervical adenopathy.  Skin:    General: Skin is warm and dry.     Capillary Refill: Capillary refill takes less than 2 seconds.     Findings: No rash.  Neurological:     General: No focal deficit present.     Mental Status: She is alert and oriented to person, place, and time.  Psychiatric:        Mood and  Affect: Mood normal.        Behavior: Behavior normal. Behavior is cooperative.        Thought Content: Thought content normal.        Judgment: Judgment normal.     UC Treatments / Results  Labs (all labs ordered are listed, but only  abnormal results are displayed) Labs Reviewed  RESP PANEL BY RT-PCR (FLU A&B, COVID) [id number]    EKG   Radiology No results found.  Procedures Procedures (including critical care time)  Medications Ordered in UC Medications - No data to display  Initial Impression / Assessment and Plan / UC Course  I have reviewed the triage vital signs and the nursing notes.  Pertinent labs & imaging results that were available during my care of the patient were reviewed by me and considered in my medical decision making (see chart for details).     Reviewed negative rapid influenza and COVID-19 test results with patient.  Discussed that she probably has a viral illness.  Recommend continue OTC cough and cold medication as directed.  May take Ibuprofen 600 mg or Tylenol 1000 mg every 8 hours as needed for sore throat/headache but do not take this if listed on the OTC cold cough medication that you decide to take.  Continue to push fluids to help loosen up mucus in sinuses and chest.  Note written for work.  Follow-up in 3 to 4 days if not improving. Final Clinical Impressions(s) / UC Diagnoses   Final diagnoses:  Viral upper respiratory illness  Sore throat  Diarrhea, unspecified type     Discharge Instructions      Recommend continue OTC cough and cold medication- encouraged to take Ibuprofen 600mg  or Tylenol 1000mg  every 8 hours as needed for sore throat/headache but do not take additional Ibuprofen or Acetaminophen if it is listed in the cough/cold medication you are taking. Continue to push fluids to help loosen up mucus in sinuses and chest. Follow-up in 3 to 4 days if not improving.     ED Prescriptions   None    PDMP not reviewed this  encounter.   , NP 11/22/21 1126

## 2021-11-21 NOTE — Discharge Instructions (Signed)
Recommend continue OTC cough and cold medication- encouraged to take Ibuprofen 600mg  or Tylenol 1000mg  every 8 hours as needed for sore throat/headache but do not take additional Ibuprofen or Acetaminophen if it is listed in the cough/cold medication you are taking. Continue to push fluids to help loosen up mucus in sinuses and chest. Follow-up in 3 to 4 days if not improving.

## 2021-11-21 NOTE — ED Triage Notes (Signed)
Pt c/o nasal congestion, headache, cough, diarrhea, vomiting, body chills x2-3days. Pt has been taking OTC cough medicine. Pt has been around sick people recently at work. Pt wanted to be tested for covid and the flu.

## 2021-12-14 ENCOUNTER — Other Ambulatory Visit: Payer: Self-pay

## 2021-12-14 ENCOUNTER — Telehealth: Payer: No Typology Code available for payment source | Admitting: Physician Assistant

## 2021-12-14 DIAGNOSIS — J069 Acute upper respiratory infection, unspecified: Secondary | ICD-10-CM | POA: Diagnosis not present

## 2021-12-14 DIAGNOSIS — B9689 Other specified bacterial agents as the cause of diseases classified elsewhere: Secondary | ICD-10-CM | POA: Diagnosis not present

## 2021-12-14 MED ORDER — BENZONATATE 100 MG PO CAPS
100.0000 mg | ORAL_CAPSULE | Freq: Three times a day (TID) | ORAL | 0 refills | Status: DC | PRN
  Filled 2021-12-14: qty 30, 10d supply, fill #0

## 2021-12-14 MED ORDER — AMOXICILLIN 500 MG PO CAPS
500.0000 mg | ORAL_CAPSULE | Freq: Two times a day (BID) | ORAL | 0 refills | Status: AC
  Filled 2021-12-14: qty 20, 10d supply, fill #0

## 2021-12-14 MED ORDER — IPRATROPIUM BROMIDE 0.03 % NA SOLN
2.0000 | Freq: Two times a day (BID) | NASAL | 0 refills | Status: DC
  Filled 2021-12-14: qty 30, 30d supply, fill #0

## 2021-12-14 NOTE — Progress Notes (Signed)
Virtual Visit Consent   Kathy Burke, you are scheduled for a virtual visit with a [facility] provider today.     Just as with appointments in the office, your consent must be obtained to participate.  Your consent will be active for this visit and any virtual visit you may have with one of our providers in the next 365 days.     If you have a MyChart account, a copy of this consent can be sent to you electronically.  All virtual visits are billed to your insurance company just like a traditional visit in the office.    As this is a virtual visit, video technology does not allow for your provider to perform a traditional examination.  This may limit your provider's ability to fully assess your condition.  If your provider identifies any concerns that need to be evaluated in person or the need to arrange testing (such as labs, EKG, etc.), we will make arrangements to do so.     Although advances in technology are sophisticated, we cannot ensure that it will always work on either your end or our end.  If the connection with a video visit is poor, the visit may have to be switched to a telephone visit.  With either a video or telephone visit, we are not always able to ensure that we have a secure connection.     I need to obtain your verbal consent now.   Are you willing to proceed with your visit today?    EMIAH PELLICANO has provided verbal consent on 12/14/2021 for a virtual visit (video or telephone).   [name], PA-C   Date: 12/14/2021 8:36 AM   Virtual Visit via Video Note   I, [name], connected with  Kathy Burke  ([id number], Sep 04, 2001) on 12/14/21 at  8:30 AM EST by a video-enabled telemedicine application and verified that I am speaking with the correct person using two identifiers.  Location: Patient: Virtual Visit Location Patient: Home Provider: Virtual Visit Location Provider: Home Office   I discussed the limitations of evaluation and management by  telemedicine and the availability of in person appointments. The patient expressed understanding and agreed to proceed.    History of Present Illness: Kathy Burke is a 21 y.o. who identifies as a female who was assigned female at birth, and is being seen today for URI symptoms.  HPI: URI  This is a new problem. The current episode started 1 to 4 weeks ago. The problem has been gradually worsening. Maximum temperature: subjective fever. Associated symptoms include congestion, coughing (mixed), ear pain (right; left mild), headaches, rhinorrhea, sinus pain and a sore throat. Pertinent negatives include no diarrhea, nausea or vomiting. Associated symptoms comments: chills. Treatments tried: different medicines but her mother has given them so she does not know names. The treatment provided mild relief.     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   Family history of diabetes mellitus type II 04/05/2015   Adiposity 04/05/2015    Allergies: No Known Allergies Medications:  Current Outpatient Medications:    amoxicillin (AMOXIL) 500 MG capsule, Take 1 capsule (500 mg total) by mouth 2 (two) times daily for 10 days., Disp: 20 capsule, Rfl: 0   benzonatate (TESSALON) 100 MG capsule, Take 1 capsule (100  mg total) by mouth 3 (three) times daily as needed., Disp: 30 capsule, Rfl: 0   ipratropium (ATROVENT) 0.03 % nasal spray, Place 2 sprays into both nostrils every 12 (twelve) hours., Disp: 30 mL, Rfl: 0  Observations/Objective: Patient is well-developed, well-nourished in no acute distress.  Resting comfortably at home.  Head is normocephalic, atraumatic.  No labored breathing.  Speech is clear and coherent with logical content.  Patient is alert and oriented at baseline.    Assessment and Plan: 1.  Bacterial upper respiratory infection - amoxicillin (AMOXIL) 500 MG capsule; Take 1 capsule (500 mg total) by mouth 2 (two) times daily for 10 days.  Dispense: 20 capsule; Refill: 0 - ipratropium (ATROVENT) 0.03 % nasal spray; Place 2 sprays into both nostrils every 12 (twelve) hours.  Dispense: 30 mL; Refill: 0 - benzonatate (TESSALON) 100 MG capsule; Take 1 capsule (100 mg total) by mouth 3 (three) times daily as needed.  Dispense: 30 capsule; Refill: 0  - Symptoms present over a week without any improvements - Amoxicillin added - Ipratropium bromide nasal spray and tessalon perles for symptomatic management of cough, congestion and drainage - Chloraseptic spray and/or salt water gargles for sore throat - Push fluids - Steam treatments and/or humidifier - Rest - Seek in person evaluation if not improving or if symptoms worsen  Follow Up Instructions: I discussed the assessment and treatment plan with the patient. The patient was provided an opportunity to ask questions and all were answered. The patient agreed with the plan and demonstrated an understanding of the instructions.  A copy of instructions were sent to the patient via MyChart unless otherwise noted below.   The patient was advised to call back or seek an in-person evaluation if the symptoms worsen or if the condition fails to improve as anticipated.  Time:  I spent 13 minutes with the patient via telehealth technology discussing the above problems/concerns.    [name], PA-C

## 2021-12-14 NOTE — Patient Instructions (Signed)
Kathy Burke, thank you for joining [name], PA-C for today's virtual visit.  While this provider is not your primary care provider (PCP), if your PCP is located in our provider database this encounter information will be shared with them immediately following your visit.  Consent: (Patient) Kathy Burke provided verbal consent for this virtual visit at the beginning of the encounter.  Current Medications:  Current Outpatient Medications:    amoxicillin (AMOXIL) 500 MG capsule, Take 1 capsule (500 mg total) by mouth 2 (two) times daily for 10 days., Disp: 20 capsule, Rfl: 0   benzonatate (TESSALON) 100 MG capsule, Take 1 capsule (100 mg total) by mouth 3 (three) times daily as needed., Disp: 30 capsule, Rfl: 0   ipratropium (ATROVENT) 0.03 % nasal spray, Place 2 sprays into both nostrils every 12 (twelve) hours., Disp: 30 mL, Rfl: 0   Medications ordered in this encounter:  Meds ordered this encounter  Medications   amoxicillin (AMOXIL) 500 MG capsule    Sig: Take 1 capsule (500 mg total) by mouth 2 (two) times daily for 10 days.    Dispense:  20 capsule    Refill:  0    Order Specific Question:   Supervising Provider    Answer:   MILLER, BRIAN [[id number]]   ipratropium (ATROVENT) 0.03 % nasal spray    Sig: Place 2 sprays into both nostrils every 12 (twelve) hours.    Dispense:  30 mL    Refill:  0    Order Specific Question:   Supervising Provider    Answer:   MILLER, BRIAN [[id number]]   benzonatate (TESSALON) 100 MG capsule    Sig: Take 1 capsule (100 mg total) by mouth 3 (three) times daily as needed.    Dispense:  30 capsule    Refill:  0    Order Specific Question:   Supervising Provider    Answer:   Hyacinth Meeker, BRIAN [[id number]]     *If you need refills on other medications prior to your next appointment, please contact your pharmacy*  Follow-Up: Call back or seek an in-person evaluation if the symptoms worsen or if the condition fails to improve as anticipated.  Other  Instructions Upper Respiratory Infection, Adult An upper respiratory infection (URI) affects the nose, throat, and upper airways that lead to the lungs. The most common type of URI is often called the common cold. URIs usually get better on their own, without medical treatment. What are the causes? A URI is caused by a germ (virus). You may catch these germs by: Breathing in droplets from an infected person's cough or sneeze. Touching something that has the germ on it (is contaminated) and then touching your mouth, nose, or eyes. What increases the risk? You are more likely to get a URI if: You are very young or very old. You have close contact with others, such as at work, school, or a health care facility. You smoke. You have long-term (chronic) heart or lung disease. You have a weakened disease-fighting system (immune system). You have nasal allergies or asthma. You have a lot of stress. You have poor nutrition. What are the signs or symptoms? Runny or stuffy (congested) nose. Cough. Sneezing. Sore throat. Headache. Feeling tired (fatigue). Fever. Not wanting to eat as much as usual. Pain in your forehead, behind your eyes, and over your cheekbones (sinus pain). Muscle aches. Redness or irritation of the eyes. Pressure in the ears or face. How is this treated? URIs usually get  better on their own within 7-10 days. Medicines cannot cure URIs, but your doctor may recommend certain medicines to help relieve symptoms, such as: Over-the-counter cold medicines. Medicines to reduce coughing (cough suppressants). Coughing is a type of defense against infection that helps to clear the nose, throat, windpipe, and lungs (respiratory system). Take these medicines only as told by your doctor. Medicines to lower your fever. Follow these instructions at home: Activity Rest as needed. If you have a fever, stay home from work or school until your fever is gone, or until your doctor says you  may return to work or school. You should stay home until you cannot spread the infection anymore (you are not contagious). Your doctor may have you wear a face mask so you have less risk of spreading the infection. Relieving symptoms Rinse your mouth often with salt water. To make salt water, dissolve -1 tsp (3-6 g) of salt in 1 cup (237 mL) of warm water. Use a cool-mist humidifier to add moisture to the air. This can help you breathe more easily. Eating and drinking  Drink enough fluid to keep your pee (urine) pale yellow. Eat soups and other clear broths. General instructions  Take over-the-counter and prescription medicines only as told by your doctor. Do not smoke or use any products that contain nicotine or tobacco. If you need help quitting, ask your doctor. Avoid being where people are smoking (avoid secondhand smoke). Stay up to date on all your shots (immunizations), and get the flu shot every year. Keep all follow-up visits. How to prevent the spread of infection to others  Wash your hands with soap and water for at least 20 seconds. If you cannot use soap and water, use hand sanitizer. Avoid touching your mouth, face, eyes, or nose. Cough or sneeze into a tissue or your sleeve or elbow. Do not cough or sneeze into your hand or into the air. Contact a doctor if: You are getting worse, not better. You have any of these: A fever or chills. Brown or red mucus in your nose. Yellow or brown fluid (discharge)coming from your nose. Pain in your face, especially when you bend forward. Swollen neck glands. Pain when you swallow. White areas in the back of your throat. Get help right away if: You have shortness of breath that gets worse. You have very bad or constant: Headache. Ear pain. Pain in your forehead, behind your eyes, and over your cheekbones (sinus pain). Chest pain. You have long-lasting (chronic) lung disease along with any of these: Making high-pitched  whistling sounds when you breathe, most often when you breathe out (wheezing). Long-lasting cough (more than 14 days). Coughing up blood. A change in your usual mucus. You have a stiff neck. You have changes in your: Vision. Hearing. Thinking. Mood. These symptoms may be an emergency. Get help right away. Call 911. Do not wait to see if the symptoms will go away. Do not drive yourself to the hospital. Summary An upper respiratory infection (URI) is caused by a germ (virus). The most common type of URI is often called the common cold. URIs usually get better within 7-10 days. Take over-the-counter and prescription medicines only as told by your doctor. This information is not intended to replace advice given to you by your health care provider. Make sure you discuss any questions you have with your health care provider. Document Revised: 06/21/2021 Document Reviewed: 06/21/2021 Elsevier Patient Education  2022 [employer].    If you have been  instructed to have an in-person evaluation today at a local Urgent Care facility, please use the link below. It will take you to a list of all of our available [facility], including address, phone number and hours of operation. Please do not delay care.  [facility]  If you or a family member do not have a primary care provider, use the link below to schedule a visit and establish care. When you choose a [facility] primary care physician or advanced practice provider, you gain a long-term partner in health. Find a Primary Care Provider  Learn more about [facility]'s in-office and virtual care options: [facility] - Get Care Now

## 2021-12-15 ENCOUNTER — Other Ambulatory Visit: Payer: Self-pay

## 2022-02-12 ENCOUNTER — Other Ambulatory Visit (HOSPITAL_BASED_OUTPATIENT_CLINIC_OR_DEPARTMENT_OTHER): Payer: Self-pay

## 2022-06-01 NOTE — Progress Notes (Deleted)
      Established patient visit   Patient: Kathy Burke   DOB: 2001-03-15   21 y.o. Female  MRN: [id number] Visit Date: 06/04/2022  Today's healthcare provider: Mila Merry, MD   No chief complaint on file.  Subjective    HPI  Obesity: Patient would like to discuss weight loss options.   Medications: Outpatient Medications Prior to Visit  Medication Sig   benzonatate (TESSALON) 100 MG capsule Take 1 capsule (100 mg total) by mouth 3 (three) times daily as needed.   ipratropium (ATROVENT) 0.03 % nasal spray Place 2 sprays into both nostrils every 12 (twelve) hours.   No facility-administered medications prior to visit.    Review of Systems  {Labs  Heme  Chem  Endocrine  Serology  Results Review (optional):23779}   Objective    There were no vitals taken for this visit. {Show previous vital signs (optional):23777}  Physical Exam  ***  No results found for any visits on 06/04/22.  Assessment & Plan     ***  No follow-ups on file.      {provider attestation***:1}   Mila Merry, MD  [facility] [phone] (phone) [phone] (fax)  [facility]

## 2022-06-04 ENCOUNTER — Ambulatory Visit: Payer: No Typology Code available for payment source | Admitting: Family Medicine

## 2022-06-20 ENCOUNTER — Ambulatory Visit: Payer: No Typology Code available for payment source | Admitting: Family Medicine

## 2022-06-22 ENCOUNTER — Other Ambulatory Visit: Payer: Self-pay

## 2022-06-22 MED ORDER — CYCLOBENZAPRINE HCL 10 MG PO TABS
ORAL_TABLET | ORAL | 0 refills | Status: DC
  Filled 2022-06-22: qty 9, 3d supply, fill #0

## 2022-06-22 MED ORDER — DICLOFENAC SODIUM 75 MG PO TBEC
DELAYED_RELEASE_TABLET | ORAL | 0 refills | Status: DC
  Filled 2022-06-22: qty 14, 7d supply, fill #0

## 2022-06-25 ENCOUNTER — Other Ambulatory Visit: Payer: Self-pay

## 2022-07-10 ENCOUNTER — Other Ambulatory Visit: Payer: Self-pay

## 2022-07-15 ENCOUNTER — Ambulatory Visit
Admission: RE | Admit: 2022-07-15 | Discharge: 2022-07-15 | Disposition: A | Discharge status: Home / Self Care | Payer: No Typology Code available for payment source | Source: Ambulatory Visit | Attending: Emergency Medicine | Admitting: Emergency Medicine

## 2022-07-15 VITALS — BP 125/88 | HR 97 | Temp 98.3°F | Ht 71.0 in | Wt 370.0 lb

## 2022-07-15 DIAGNOSIS — B3731 Acute candidiasis of vulva and vagina: Secondary | ICD-10-CM | POA: Diagnosis present

## 2022-07-15 DIAGNOSIS — A5901 Trichomonal vulvovaginitis: Secondary | ICD-10-CM | POA: Diagnosis not present

## 2022-07-15 DIAGNOSIS — N760 Acute vaginitis: Secondary | ICD-10-CM | POA: Diagnosis not present

## 2022-07-15 DIAGNOSIS — B9689 Other specified bacterial agents as the cause of diseases classified elsewhere: Secondary | ICD-10-CM | POA: Diagnosis not present

## 2022-07-15 LAB — URINALYSIS, ROUTINE W REFLEX MICROSCOPIC
Glucose, UA: NEGATIVE mg/dL
Nitrite: NEGATIVE
Protein, ur: 30 mg/dL — AB
Specific Gravity, Urine: 1.02 (ref 1.005–1.030)
pH: 6.5 (ref 5.0–8.0)

## 2022-07-15 LAB — URINALYSIS, MICROSCOPIC (REFLEX)

## 2022-07-15 LAB — PREGNANCY, URINE: Preg Test, Ur: NEGATIVE

## 2022-07-15 LAB — WET PREP, GENITAL
Sperm: NONE SEEN
WBC, Wet Prep HPF POC: >=10 — AB (ref <10)
Yeast Wet Prep HPF POC: NONE SEEN

## 2022-07-15 MED ORDER — FLUCONAZOLE 200 MG PO TABS: 200.0000 mg | ORAL_TABLET | Freq: Every day | ORAL | 1 refills | Status: AC

## 2022-07-15 MED ORDER — METRONIDAZOLE 500 MG PO TABS: 500.0000 mg | ORAL_TABLET | Freq: Two times a day (BID) | ORAL | 0 refills | Status: DC

## 2022-07-15 NOTE — Discharge Instructions (Addendum)
Take the Flagyl (metronidazole) 500 mg twice daily for treatment of your bacterial vaginosis.  Avoid alcohol while on the metronidazole as taken together will cause of vomiting.  Bacterial vaginosis is often caused by a imbalance of bacteria in your vaginal vault.  This is sometimes a result of using tampons or hormonal fluctuations during her menstrual cycle.  You if your symptoms are recurrent you can try using a boric acid suppository twice weekly to help maintain the acid-base balance in your vagina vault which could prevent further infection.  You can also try vaginal probiotics to help return normal bacterial balance.   The metronidazole will also treat your trichomonas infection.  Your wet prep also showed the presence of yeast.  Please take the Diflucan for treatment of your yeast infection in your vagina.  Take 1 tablet now and repeat in 7 days if you are still having symptoms.  Your gonorrhea and Chlamydia test will back tomorrow and if either of the tests are positive you will receive a call and treatment will be arranged.  If your testing is negative you will receive notice that you have new labs in your MyChart.  Your sexual partner needs to also be tested and treated.

## 2022-07-15 NOTE — ED Triage Notes (Signed)
Patient presents to UC for burning with urination -- burning started earlier this week.   Patient denies any discharge.

## 2022-07-15 NOTE — ED Provider Notes (Addendum)
[facility]    CSN: [id number] Arrival date & time: 07/15/22  1259      History   Chief Complaint Chief Complaint  Patient presents with   SEXUALLY TRANSMITTED DISEASE   Dysuria    HPI Kathy Burke is a 21 y.o. female.   HPI  21 year old female here for evaluation of urinary complaint.  Patient reports that for the last week she has been experiencing burning with urination.  She states that this is her only symptom.  She denies any fever, urgency or frequency of urination, low back pain, abdominal pain, vaginal discharge, or vaginal itching.  She states she is concerned that she may have an STI.  She has a single partner and that she is having unprotected sex with.  She denies him having any symptoms but she just wants to be sure.  History reviewed. No pertinent past medical history.  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      Home Medications    Prior to Admission medications   Medication Sig Start Date End Date Taking? Authorizing Provider  fluconazole (DIFLUCAN) 200 MG tablet Take 1 tablet (200 mg total) by mouth daily for 2 doses. 07/15/22 07/17/22 Yes Becky Augusta, NP  metroNIDAZOLE (FLAGYL) 500 MG tablet Take 1 tablet (500 mg total) by mouth 2 (two) times daily. 07/15/22  Yes Becky Augusta, NP  benzonatate (TESSALON) 100 MG capsule Take 1 capsule (100 mg total) by mouth 3 (three) times daily as needed. 12/14/21   [name], PA-C  cyclobenzaprine (FLEXERIL) 10 MG tablet take 1 tablet by oral route 3 times every day as needed for MUSCLE SPASM  06/22/22     diclofenac (VOLTAREN) 75 MG EC tablet take 1 tablet by oral route 2 times every day 06/22/22     ipratropium (ATROVENT) 0.03 % nasal spray Place 2 sprays into both nostrils every 12 (twelve) hours. 12/14/21   [name], PA-C  amphetamine-dextroamphetamine (ADDERALL XR) 20 MG 24 hr capsule Take 1 capsule (20 mg total) by mouth daily. 08/13/19 01/20/21  [name], PA-C  loratadine (CLARITIN) 10 MG tablet Take by mouth.  03/08/21  [provider]    Family History Family History  Problem Relation Age of Onset   Hypertension Mother    Hypertension Father    Diabetes Maternal Grandmother    Diabetes Paternal Grandmother     Social History Social History   Tobacco Use   Smoking status: Never   Smokeless tobacco: Never  Vaping Use   Vaping Use: Never used  Substance Use Topics   Alcohol use: No   Drug use: No     Allergies   Patient has no known allergies.   Review of Systems Review of Systems  Constitutional:  Negative for fever.  Gastrointestinal:  Negative for abdominal pain.  Genitourinary:  Positive for dysuria. Negative for frequency, hematuria, urgency, vaginal discharge and vaginal pain.  Musculoskeletal:  Negative for back pain.  Hematological: Negative.   Psychiatric/Behavioral: Negative.       Physical  Exam Triage Vital Signs ED Triage Vitals  Enc Vitals Group     BP 07/15/22 1310 125/88     Pulse Rate 07/15/22 1310 97     Resp --      Temp 07/15/22 1310 98.3 F (36.8 C)     Temp Source 07/15/22 1310 Oral     SpO2 --      Weight 07/15/22 1306 (!) 370 lb (167.8 kg)     Height 07/15/22 1306 5\' 11"  (1.803 m)     Head Circumference --      Peak Flow --      Pain Score 07/15/22 1306 8     Pain Loc --      Pain Edu? --      Excl. in GC? --    No data found.  Updated Vital Signs BP 125/88 (BP Location: Left Arm)   Pulse 97   Temp 98.3 F (36.8 C) (Oral)   Ht 5\' 11"  (1.803 m)   Wt (!) 370 lb (167.8 kg)   LMP   (LMP Unknown)   BMI 51.60 kg/m   Visual Acuity Right Eye Distance:   Left Eye Distance:   Bilateral Distance:    Right Eye Near:   Left Eye Near:    Bilateral Near:     Physical Exam Vitals and nursing note reviewed.  Constitutional:      Appearance: Normal appearance. She is not ill-appearing.  HENT:     Head: Normocephalic and atraumatic.  Cardiovascular:     Rate and Rhythm: Normal rate and regular rhythm.     Pulses: Normal pulses.     Heart sounds: Normal heart sounds. No murmur heard.    No friction rub. No gallop.  Pulmonary:     Effort: Pulmonary effort is normal.     Breath sounds: Normal breath sounds. No wheezing, rhonchi or rales.  Abdominal:     General: Abdomen is flat.     Palpations: Abdomen is soft.     Tenderness: There is no abdominal tenderness. There is no right CVA tenderness, left CVA tenderness or guarding.  Skin:    General: Skin is warm and dry.     Capillary Refill: Capillary refill takes less than 2 seconds.     Findings: No erythema or rash.  Neurological:     General: No focal deficit present.     Mental Status: She is alert and oriented to person, place, and time.  Psychiatric:        Mood and Affect: Mood normal.        Behavior: Behavior normal.        Thought Content: Thought content normal.        Judgment: Judgment normal.      UC Treatments / Results  Labs (all labs ordered are listed, but only abnormal results are displayed) Labs Reviewed  WET PREP, GENITAL - Abnormal; Notable for the following components:      Result Value   Trich, Wet Prep PRESENT (*)    Clue Cells Wet Prep HPF POC PRESENT (*)    WBC, Wet Prep HPF POC >=10 (*)    All other components within normal limits  URINALYSIS, ROUTINE W REFLEX MICROSCOPIC - Abnormal; Notable for the following components:   APPearance CLOUDY (*)    Hgb urine dipstick TRACE (*)    Bilirubin Urine SMALL (*)    Ketones, ur TRACE (*)    Protein, ur 30 (*)    Leukocytes,Ua LARGE  (*)  All other components within normal limits  URINALYSIS, MICROSCOPIC (REFLEX) - Abnormal; Notable for the following components:   Bacteria, UA FEW (*)    Trichomonas, UA PRESENT (*)    All other components within normal limits  PREGNANCY, URINE  CERVICOVAGINAL ANCILLARY ONLY    EKG   Radiology No results found.  Procedures Procedures (including critical care time)  Medications Ordered in UC Medications - No data to display  Initial Impression / Assessment and Plan / UC Course  I have reviewed the triage vital signs and the nursing notes.  Pertinent labs & imaging results that were available during my care of the patient were reviewed by me and considered in my medical decision making (see chart for details).   Patient is a very pleasant, nontoxic-appearing 21 year old female here for evaluation of dysuria with no other associated symptoms that has been going on for the past week.  She states that she is having unprotected intercourse with a single partner.  She denies him having any symptoms.  She is just concerned that she may have contracted an infection because she feels like he is being unfaithful though he denies it.  Physical exam reveals a benign cardiopulmonary exam with S1-S2 heart sounds with regular rate and rhythm and lung sounds are clear to auscultation all fields.  No CVA tenderness on exam.  Abdomen is protuberant but soft and nontender.  We will check urinalysis, urine pregnancy test, vaginal wet prep, gonorrhea and chlamydia.  Urine pregnancy test is negative.  Urinalysis shows cloudy appearance with trace hemoglobin, small bilirubin, trace ketones, 30 protein, and large leukocyte esterase.  Reflex micro shows 21-50 WBCs, few bacteria, 11-20 squamous epithelials, and trichomonas.  I will not send urine for culture as this is a contaminated specimen.  Vaginal wet prep shows presence of trichomonas as well as clue cells.  Yeast is negative.  Gonorrhea chlamydia  swabs are still pending.  I will discharge patient home with a diagnosis of trichomoniasis and bacterial vaginosis and treat both with metronidazole twice daily for 7 days.  Lab came out and added a correction to the wet prep.  Yeast was also seen.  I will also send a prescription of Diflucan to the pharmacy for the patient for treatment of her yeast vaginitis.   Final Clinical Impressions(s) / UC Diagnoses   Final diagnoses:  BV (bacterial vaginosis)  Trichomonas vaginalis (TV) infection  Vaginal yeast infection     Discharge Instructions      Take the Flagyl (metronidazole) 500 mg twice daily for treatment of your bacterial vaginosis.  Avoid alcohol while on the metronidazole as taken together will cause of vomiting.  Bacterial vaginosis is often caused by a imbalance of bacteria in your vaginal vault.  This is sometimes a result of using tampons or hormonal fluctuations during her menstrual cycle.  You if your symptoms are recurrent you can try using a boric acid suppository twice weekly to help maintain the acid-base balance in your vagina vault which could prevent further infection.  You can also try vaginal probiotics to help return normal bacterial balance.   The metronidazole will also treat your trichomonas infection.  Your wet prep also showed the presence of yeast.  Please take the Diflucan for treatment of your yeast infection in your vagina.  Take 1 tablet now and repeat in 7 days if you are still having symptoms.  Your gonorrhea and Chlamydia test will back tomorrow and if either of the tests are positive you will  receive a call and treatment will be arranged.  If your testing is negative you will receive notice that you have new labs in your MyChart.  Your sexual partner needs to also be tested and treated.     ED Prescriptions     Medication Sig Dispense Auth. Provider   metroNIDAZOLE (FLAGYL) 500 MG tablet Take 1 tablet (500 mg total) by mouth 2 (two)  times daily. 14 tablet Becky Augusta, NP   fluconazole (DIFLUCAN) 200 MG tablet Take 1 tablet (200 mg total) by mouth daily for 2 doses. 2 tablet Becky Augusta, NP      PDMP not reviewed this encounter.   Becky Augusta, NP 07/15/22 1357    Becky Augusta, NP 07/15/22 1406

## 2022-07-16 LAB — CERVICOVAGINAL ANCILLARY ONLY
Chlamydia: NEGATIVE
Comment: NEGATIVE
Comment: NORMAL
Neisseria Gonorrhea: NEGATIVE

## 2022-10-03 ENCOUNTER — Ambulatory Visit
Admission: RE | Admit: 2022-10-03 | Discharge: 2022-10-03 | Disposition: A | Discharge status: Home / Self Care | Payer: No Typology Code available for payment source | Source: Ambulatory Visit | Attending: Urgent Care | Admitting: Urgent Care

## 2022-10-03 ENCOUNTER — Other Ambulatory Visit: Payer: Self-pay

## 2022-10-03 VITALS — BP 139/91 | HR 95 | Temp 97.7°F | Resp 16

## 2022-10-03 DIAGNOSIS — Z1152 Encounter for screening for COVID-19: Secondary | ICD-10-CM | POA: Insufficient documentation

## 2022-10-03 DIAGNOSIS — Z202 Contact with and (suspected) exposure to infections with a predominantly sexual mode of transmission: Secondary | ICD-10-CM | POA: Diagnosis present

## 2022-10-03 DIAGNOSIS — R300 Dysuria: Secondary | ICD-10-CM | POA: Diagnosis present

## 2022-10-03 DIAGNOSIS — N3000 Acute cystitis without hematuria: Secondary | ICD-10-CM | POA: Diagnosis not present

## 2022-10-03 DIAGNOSIS — R051 Acute cough: Secondary | ICD-10-CM | POA: Insufficient documentation

## 2022-10-03 LAB — POCT URINALYSIS DIP (MANUAL ENTRY)
Bilirubin, UA: NEGATIVE
Glucose, UA: NEGATIVE mg/dL
Ketones, POC UA: NEGATIVE mg/dL
Nitrite, UA: NEGATIVE
Protein Ur, POC: 30 mg/dL — AB
Spec Grav, UA: 1.02 (ref 1.010–1.025)
Urobilinogen, UA: 1 E.U./dL
pH, UA: 7 (ref 5.0–8.0)

## 2022-10-03 LAB — RESP PANEL BY RT-PCR (RSV, FLU A&B, COVID)  RVPGX2
Influenza A by PCR: NEGATIVE
Influenza B by PCR: NEGATIVE
Resp Syncytial Virus by PCR: NEGATIVE
SARS Coronavirus 2 by RT PCR: NEGATIVE

## 2022-10-03 LAB — POCT URINE PREGNANCY: Preg Test, Ur: NEGATIVE

## 2022-10-03 MED ORDER — NITROFURANTOIN MONOHYD MACRO 100 MG PO CAPS
100.0000 mg | ORAL_CAPSULE | Freq: Two times a day (BID) | ORAL | 0 refills | Status: DC
  Filled 2022-10-03: qty 10, 5d supply, fill #0

## 2022-10-03 NOTE — ED Triage Notes (Signed)
Pt. Presents to UC w/ c/o possible exposure to STD. Pt. States she has been having burning during urination and pain in her throat.

## 2022-10-03 NOTE — Discharge Instructions (Addendum)
The results of your swabs will be posted to your MyChart account.  If there are any positive results, you will be contacted to arrange treatment.  Follow up here or with your primary care provider if your symptoms are worsening or not improving.

## 2022-10-03 NOTE — ED Provider Notes (Addendum)
[facility]    CSN: [id number] Arrival date & time: 10/03/22  1201      History   Chief Complaint Chief Complaint  Patient presents with   SEXUALLY TRANSMITTED DISEASE    Entered by patient    HPI Kathy Burke is a 21 y.o. female.   HPI  Presents to UC with concern for possible exposure to STD.  She states a concern that her "ex" may have infected her.  Endorses both vaginal and oral intercourse without condom.  Endorses burning during urination as well as pain in her throat.  Also reports cough and requests testing for covid.  History reviewed. No pertinent past medical history.  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      Home Medications    Prior to Admission medications   Medication Sig Start Date End Date Taking? Authorizing Provider  benzonatate (TESSALON) 100 MG capsule Take 1 capsule (100 mg total) by mouth 3 (three) times daily as needed. 12/14/21   Mar Daring, PA-C  cyclobenzaprine (FLEXERIL) 10 MG tablet take 1 tablet by oral route 3 times every day as needed for MUSCLE SPASM 06/22/22     diclofenac (VOLTAREN) 75 MG EC tablet take 1 tablet by oral route 2 times every day 06/22/22     ipratropium (ATROVENT) 0.03 % nasal spray Place 2 sprays into both nostrils every 12 (twelve) hours. 12/14/21   Mar Daring, PA-C  metroNIDAZOLE (FLAGYL) 500 MG tablet Take 1 tablet (500 mg total) by mouth 2 (two) times daily. 07/15/22   Margarette Canada, NP  amphetamine-dextroamphetamine (ADDERALL XR) 20 MG 24 hr capsule Take 1 capsule (20 mg total) by  mouth daily. 08/13/19 01/20/21  Mar Daring, PA-C  loratadine (CLARITIN) 10 MG tablet Take by mouth.  03/08/21  [provider]    Family History Family History  Problem Relation Age of Onset   Hypertension Mother    Hypertension Father    Diabetes Maternal Grandmother    Diabetes Paternal Grandmother     Social History Social History   Tobacco Use   Smoking status: Never   Smokeless tobacco: Never  Vaping Use   Vaping Use: Never used  Substance Use Topics   Alcohol use: No   Drug use: No     Allergies   Patient has no known allergies.   Review of Systems Review of Systems   Physical Exam Triage Vital Signs ED Triage Vitals [10/03/22 1226]  Enc Vitals Group     BP (!) 139/91     Pulse Rate 95     Resp 16     Temp 97.7 F (36.5 C)     Temp src      SpO2 97 %     Weight      Height      Head Circumference      Peak Flow      Pain Score 0     Pain Loc      Pain Edu?      Excl.  in GC?    No data found.  Updated Vital Signs BP (!) 139/91   Pulse 95   Temp 97.7 F (36.5 C)   Resp 16   SpO2 97%   Visual Acuity Right Eye Distance:   Left Eye Distance:   Bilateral Distance:    Right Eye Near:   Left Eye Near:    Bilateral Near:     Physical Exam Vitals reviewed.  Constitutional:      Appearance: Normal appearance.  HENT:     Mouth/Throat:     Tonsils: No tonsillar exudate.     Comments: Tonsillar stones are present bilaterally. Skin:    General: Skin is warm and dry.  Neurological:     General: No focal deficit present.     Mental Status: She is alert and oriented to person, place, and time.  Psychiatric:        Mood and Affect: Mood normal.        Behavior: Behavior normal.      UC Treatments / Results  Labs (all labs ordered are listed, but only abnormal results are displayed) Labs Reviewed - No data to display  EKG   Radiology No results found.  Procedures Procedures (including critical care  time)  Medications Ordered in UC Medications - No data to display  Initial Impression / Assessment and Plan / UC Course  I have reviewed the triage vital signs and the nursing notes.  Pertinent labs & imaging results that were available during my care of the patient were reviewed by me and considered in my medical decision making (see chart for details).   UA is positive with large leuks. Will treat for acute cystitis. Vaginal and oral swab obtained and pending.  Respiratory swab obtained and pending.   Final Clinical Impressions(s) / UC Diagnoses   Final diagnoses:  None   Discharge Instructions   None    ED Prescriptions   None    PDMP not reviewed this encounter.   Charma Igo, FNP 10/03/22 1319    Charma Igo, FNP 10/03/22 1322

## 2022-10-04 ENCOUNTER — Telehealth (HOSPITAL_COMMUNITY): Payer: Self-pay | Admitting: Emergency Medicine

## 2022-10-04 LAB — CERVICOVAGINAL ANCILLARY ONLY
Bacterial Vaginitis (gardnerella): POSITIVE — AB
Candida Glabrata: NEGATIVE
Candida Vaginitis: NEGATIVE
Chlamydia: NEGATIVE
Comment: NEGATIVE
Comment: NEGATIVE
Comment: NEGATIVE
Comment: NEGATIVE
Comment: NEGATIVE
Comment: NORMAL
Neisseria Gonorrhea: NEGATIVE
Trichomonas: POSITIVE — AB

## 2022-10-04 LAB — HIV ANTIBODY (ROUTINE TESTING W REFLEX): HIV Screen 4th Generation wRfx: NONREACTIVE

## 2022-10-04 LAB — CYTOLOGY, (ORAL, ANAL, URETHRAL) ANCILLARY ONLY
Chlamydia: NEGATIVE
Comment: NEGATIVE
Comment: NORMAL
Neisseria Gonorrhea: NEGATIVE

## 2022-10-04 LAB — RPR: RPR Ser Ql: NONREACTIVE

## 2022-10-04 MED ORDER — METRONIDAZOLE 500 MG PO TABS: 500.0000 mg | ORAL_TABLET | Freq: Two times a day (BID) | ORAL | 0 refills | Status: DC

## 2023-12-29 NOTE — Progress Notes (Unsigned)
New patient visit  Patient: Kathy Burke   DOB: 2001/07/21   22 y.o. Female  MRN: [id number] Visit Date: 12/30/2023  Today's healthcare provider: Debera Lat, PA-C   No chief complaint on file.  Subjective    Kathy Burke is a 23 y.o. female who presents today as a new patient to establish care.  HPI  *** Discussed the use of AI scribe software for clinical note transcription with the patient, who gave verbal consent to proceed.  History of Present Illness         Vaccination, screening and pap, old pt of dr. Sherrie Mustache   No past medical history on file. Past Surgical History:  Procedure Laterality Date   pt denies     TONSILLECTOMY  as a baby   Family Status  Relation Name Status   Mother  Alive   Father  Alive   MGM  Alive   PGM  Alive  No partnership data on file   Family History  Problem Relation Age of Onset   Hypertension Mother    Hypertension Father    Diabetes Maternal Grandmother    Diabetes Paternal Grandmother    Social History   Socioeconomic History   Marital status: Single    Spouse name: Not on file   Number of children: Not on file   Years of education: 9th Grade   Highest education level: Not on file  Occupational History   Occupation: Full Time Student    Comment: Freight forwarder  Tobacco Use   Smoking status: Never   Smokeless tobacco: Never  Vaping Use   Vaping status: Never Used  Substance and Sexual Activity   Alcohol use: No   Drug use: No   Sexual activity: Not Currently    Birth control/protection: I.U.D.  Other Topics Concern   Not on file  Social History Narrative   Not on file   Social Drivers of Health   Financial Resource Strain: Not on file  Food Insecurity: Not on file  Transportation Needs: Not on file  Physical Activity: Not on file  Stress: Not on file  Social Connections: Not on file   Outpatient Medications Prior to Visit  Medication Sig   benzonatate (TESSALON) 100 MG capsule Take 1 capsule  (100 mg total) by mouth 3 (three) times daily as needed.   cyclobenzaprine (FLEXERIL) 10 MG tablet take 1 tablet by oral route 3 times every day as needed for MUSCLE SPASM   diclofenac (VOLTAREN) 75 MG EC tablet take 1 tablet by oral route 2 times every day   ipratropium (ATROVENT) 0.03 % nasal spray Place 2 sprays into both nostrils every 12 (twelve) hours.   metroNIDAZOLE (FLAGYL) 500 MG tablet Take 1 tablet (500 mg total) by mouth 2 (two) times daily.   nitrofurantoin, macrocrystal-monohydrate, (MACROBID) 100 MG capsule Take 1 capsule (100 mg total) by mouth 2 (two) times daily.   No facility-administered medications prior to visit.   No Known Allergies  Immunization History  Administered Date(s) Administered   DTaP 09/09/2001, 11/17/2001, 01/20/2002, 08/06/2002, 11/23/2002, 03/28/2006   HIB (PRP-OMP) 09/09/2001, 11/17/2001, 01/20/2002, 08/06/2002   HPV 9-valent 08/21/2017, 12/23/2017   HPV Quadrivalent 07/07/2013   Hepatitis A 05/09/2012, 07/07/2013   Hepatitis B 02-08-2001, 09/09/2001, 11/23/2002   IPV 09/09/2001, 11/17/2001, 01/20/2002, 03/28/2006   Influenza Split 12/14/2011, 10/24/2012   Influenza,inj,Quad PF,6+ Mos 08/21/2017   MMR 08/06/2002, 03/28/2006   Meningococcal B, OMV 07/13/2019   Meningococcal Conjugate 07/07/2013   Meningococcal Mcv4o 07/13/2019  PFIZER(Purple Top)SARS-COV-2 Vaccination 04/16/2020, 05/10/2020   Pneumococcal-Unspecified 11/23/2002, 03/04/2003   Tdap 05/09/2012   Varicella 08/06/2002, 03/28/2006    Health Maintenance  Topic Date Due   Hepatitis C Screening  Never done   DTaP/Tdap/Td (7 - Td or Tdap) 05/09/2022   Cervical Cancer Screening (Pap smear)  Never done   INFLUENZA VACCINE  07/04/2023   COVID-19 Vaccine (3 - 2024-25 season) 08/04/2023   HPV VACCINES  Completed   HIV Screening  Completed    Patient Care Team: Malva Limes, MD as PCP - General (Family Medicine)  Review of Systems  All other systems reviewed and are  negative.  Except see HPI   {Insert previous labs (optional):23779} {See past labs  Heme  Chem  Endocrine  Serology  Results Review (optional):1}   Objective    There were no vitals taken for this visit. {Insert last BP/Wt (optional):23777}{See vitals history (optional):1}   Physical Exam Vitals reviewed.  Constitutional:      General: She is not in acute distress.    Appearance: Normal appearance. She is well-developed. She is not diaphoretic.  HENT:     Head: Normocephalic and atraumatic.  Eyes:     General: No scleral icterus.    Conjunctiva/sclera: Conjunctivae normal.  Neck:     Thyroid: No thyromegaly.  Cardiovascular:     Rate and Rhythm: Normal rate and regular rhythm.     Pulses: Normal pulses.     Heart sounds: Normal heart sounds. No murmur heard. Pulmonary:     Effort: Pulmonary effort is normal. No respiratory distress.     Breath sounds: Normal breath sounds. No wheezing, rhonchi or rales.  Musculoskeletal:     Cervical back: Neck supple.     Right lower leg: No edema.     Left lower leg: No edema.  Lymphadenopathy:     Cervical: No cervical adenopathy.  Skin:    General: Skin is warm and dry.     Findings: No rash.  Neurological:     Mental Status: She is alert and oriented to person, place, and time. Mental status is at baseline.  Psychiatric:        Mood and Affect: Mood normal.        Behavior: Behavior normal.     Depression Screen    02/07/2021    1:54 PM 12/05/2020    4:07 PM 07/11/2018   10:10 AM 08/21/2017    4:31 PM  PHQ 2/9 Scores  PHQ - 2 Score 0 0 0 2  PHQ- 9 Score 0      No results found for any visits on 12/30/23.  Assessment & Plan     ***   Encounter to establish care Welcomed to our clinic Reviewed past medical hx, social hx, family hx and surgical hx Pt advised to send all vaccination records or screening   No follow-ups on file.    The patient was advised to call back or seek an in-person evaluation if the  symptoms worsen or if the condition fails to improve as anticipated.  I discussed the assessment and treatment plan with the patient. The patient was provided an opportunity to ask questions and all were answered. The patient agreed with the plan and demonstrated an understanding of the instructions.  I, Debera Lat, PA-C have reviewed all documentation for this visit. The documentation on  12/30/2023   for the exam, diagnosis, procedures, and orders are all accurate and complete.  Debera Lat, PAC, MMS [facility] [phone] (phone) [phone] (fax)  [facility]

## 2023-12-30 ENCOUNTER — Other Ambulatory Visit (HOSPITAL_COMMUNITY)
Admission: RE | Admit: 2023-12-30 | Discharge: 2023-12-30 | Disposition: A | Discharge status: Home / Self Care | Payer: Commercial Managed Care - PPO | Source: Ambulatory Visit | Attending: Physician Assistant | Admitting: Physician Assistant

## 2023-12-30 ENCOUNTER — Encounter: Payer: Self-pay | Admitting: Physician Assistant

## 2023-12-30 ENCOUNTER — Ambulatory Visit: Payer: Commercial Managed Care - PPO | Admitting: Physician Assistant

## 2023-12-30 VITALS — BP 105/61 | HR 113 | Resp 18 | Ht 71.0 in | Wt 382.9 lb

## 2023-12-30 DIAGNOSIS — J309 Allergic rhinitis, unspecified: Secondary | ICD-10-CM

## 2023-12-30 DIAGNOSIS — Z113 Encounter for screening for infections with a predominantly sexual mode of transmission: Secondary | ICD-10-CM | POA: Insufficient documentation

## 2023-12-30 DIAGNOSIS — F908 Attention-deficit hyperactivity disorder, other type: Secondary | ICD-10-CM | POA: Diagnosis not present

## 2023-12-30 DIAGNOSIS — A5602 Chlamydial vulvovaginitis: Secondary | ICD-10-CM | POA: Diagnosis not present

## 2023-12-30 DIAGNOSIS — Z7689 Persons encountering health services in other specified circumstances: Secondary | ICD-10-CM

## 2023-12-30 DIAGNOSIS — Z202 Contact with and (suspected) exposure to infections with a predominantly sexual mode of transmission: Secondary | ICD-10-CM | POA: Diagnosis not present

## 2023-12-30 DIAGNOSIS — B9689 Other specified bacterial agents as the cause of diseases classified elsewhere: Secondary | ICD-10-CM | POA: Insufficient documentation

## 2023-12-30 DIAGNOSIS — R059 Cough, unspecified: Secondary | ICD-10-CM

## 2023-12-30 DIAGNOSIS — N926 Irregular menstruation, unspecified: Secondary | ICD-10-CM

## 2023-12-30 DIAGNOSIS — N760 Acute vaginitis: Secondary | ICD-10-CM | POA: Insufficient documentation

## 2023-12-30 LAB — POC COVID19/FLU A&B COMBO
Covid Antigen, POC: NEGATIVE
Influenza A Antigen, POC: POSITIVE — AB
Influenza B Antigen, POC: NEGATIVE

## 2024-01-09 ENCOUNTER — Ambulatory Visit (INDEPENDENT_AMBULATORY_CARE_PROVIDER_SITE_OTHER): Payer: Commercial Managed Care - PPO | Admitting: Physician Assistant

## 2024-01-09 DIAGNOSIS — Z719 Counseling, unspecified: Secondary | ICD-10-CM

## 2024-01-09 DIAGNOSIS — Z202 Contact with and (suspected) exposure to infections with a predominantly sexual mode of transmission: Secondary | ICD-10-CM | POA: Diagnosis not present

## 2024-01-09 NOTE — Progress Notes (Signed)
 Patient arrived with lab orders to collect an Aptima swab that she was unable to collect at previous office visit.   She obtained the swab and swab placed in outgoing lab for testing.

## 2024-01-10 LAB — CBC WITH DIFFERENTIAL/PLATELET
Basophils Absolute: 0 10*3/uL (ref 0.0–0.2)
Basos: 0 %
EOS (ABSOLUTE): 0.2 10*3/uL (ref 0.0–0.4)
Eos: 3 %
Hematocrit: 33.5 % — ABNORMAL LOW (ref 34.0–46.6)
Hemoglobin: 10.1 g/dL — ABNORMAL LOW (ref 11.1–15.9)
Immature Grans (Abs): 0.1 10*3/uL (ref 0.0–0.1)
Immature Granulocytes: 1 %
Lymphocytes Absolute: 2.8 10*3/uL (ref 0.7–3.1)
Lymphs: 41 %
MCH: 22.7 pg — ABNORMAL LOW (ref 26.6–33.0)
MCHC: 30.1 g/dL — ABNORMAL LOW (ref 31.5–35.7)
MCV: 76 fL — ABNORMAL LOW (ref 79–97)
Monocytes Absolute: 0.6 10*3/uL (ref 0.1–0.9)
Monocytes: 8 %
Neutrophils Absolute: 3.2 10*3/uL (ref 1.4–7.0)
Neutrophils: 47 %
Platelets: 504 10*3/uL — ABNORMAL HIGH (ref 150–450)
RBC: 4.44 x10E6/uL (ref 3.77–5.28)
RDW: 15.9 % — ABNORMAL HIGH (ref 11.7–15.4)
WBC: 6.9 10*3/uL (ref 3.4–10.8)

## 2024-01-10 LAB — COMPREHENSIVE METABOLIC PANEL
ALT: 12 [IU]/L (ref 0–32)
AST: 19 [IU]/L (ref 0–40)
Albumin: 3.8 g/dL — ABNORMAL LOW (ref 4.0–5.0)
Alkaline Phosphatase: 60 [IU]/L (ref 44–121)
BUN/Creatinine Ratio: 8 — ABNORMAL LOW (ref 9–23)
BUN: 6 mg/dL (ref 6–20)
Bilirubin Total: 0.3 mg/dL (ref 0.0–1.2)
CO2: 25 mmol/L (ref 20–29)
Calcium: 9 mg/dL (ref 8.7–10.2)
Chloride: 107 mmol/L — ABNORMAL HIGH (ref 96–106)
Creatinine, Ser: 0.78 mg/dL (ref 0.57–1.00)
Globulin, Total: 3.9 g/dL (ref 1.5–4.5)
Glucose: 114 mg/dL — ABNORMAL HIGH (ref 70–99)
Potassium: 4.2 mmol/L (ref 3.5–5.2)
Sodium: 139 mmol/L (ref 134–144)
Total Protein: 7.7 g/dL (ref 6.0–8.5)
eGFR: 110 mL/min/{1.73_m2} (ref >59)

## 2024-01-10 LAB — LIPID PANEL
Chol/HDL Ratio: 3.8 {ratio} (ref 0.0–4.4)
Cholesterol, Total: 142 mg/dL (ref 100–199)
HDL: 37 mg/dL — ABNORMAL LOW (ref >39)
LDL Chol Calc (NIH): 83 mg/dL (ref 0–99)
Triglycerides: 123 mg/dL (ref 0–149)
VLDL Cholesterol Cal: 22 mg/dL (ref 5–40)

## 2024-01-10 LAB — HEMOGLOBIN A1C
Est. average glucose Bld gHb Est-mCnc: 108 mg/dL
Hgb A1c MFr Bld: 5.4 % (ref 4.8–5.6)

## 2024-01-10 LAB — TSH: TSH: 1.55 u[IU]/mL (ref 0.450–4.500)

## 2024-01-10 LAB — HIV ANTIBODY (ROUTINE TESTING W REFLEX): HIV Screen 4th Generation wRfx: NONREACTIVE

## 2024-01-13 ENCOUNTER — Encounter: Payer: Self-pay | Admitting: Physician Assistant

## 2024-01-13 LAB — CERVICOVAGINAL ANCILLARY ONLY
Bacterial Vaginitis (gardnerella): POSITIVE — AB
Candida Glabrata: NEGATIVE
Candida Vaginitis: NEGATIVE
Chlamydia: POSITIVE — AB
Comment: NEGATIVE
Comment: NEGATIVE
Comment: NEGATIVE
Comment: NEGATIVE
Comment: NEGATIVE
Comment: NORMAL
Neisseria Gonorrhea: NEGATIVE
Trichomonas: NEGATIVE

## 2024-01-14 ENCOUNTER — Other Ambulatory Visit: Payer: Self-pay | Admitting: Physician Assistant

## 2024-01-14 ENCOUNTER — Other Ambulatory Visit: Payer: Self-pay

## 2024-01-14 ENCOUNTER — Encounter: Payer: Self-pay | Admitting: Physician Assistant

## 2024-01-14 DIAGNOSIS — N760 Acute vaginitis: Secondary | ICD-10-CM

## 2024-01-14 MED ORDER — DOXYCYCLINE HYCLATE 100 MG PO TABS
100.0000 mg | ORAL_TABLET | Freq: Two times a day (BID) | ORAL | 0 refills | Status: DC
  Filled 2024-01-14: qty 14, 7d supply, fill #0

## 2024-01-14 MED ORDER — METRONIDAZOLE 500 MG PO TABS
500.0000 mg | ORAL_TABLET | Freq: Two times a day (BID) | ORAL | 0 refills | Status: AC
  Filled 2024-01-14: qty 14, 7d supply, fill #0

## 2024-01-14 NOTE — Progress Notes (Signed)
Please, call and fax a completed Confidential Communicable Disease Report form to our local health department.  Patient requested STD screening due to partner's infidelity.  Please, let pt know that she has chlamydia and bacterial vaginosis. Med will be called in.  Check pregnancy status before taking these medications By cdc,  all sexual partners should be notified from 30 days before the onset of symptoms to completion of therapy that they must be evaluated by a physician or local health department

## 2024-01-27 ENCOUNTER — Encounter: Payer: Self-pay | Admitting: Obstetrics

## 2024-01-28 ENCOUNTER — Encounter: Payer: Self-pay | Admitting: Obstetrics

## 2024-01-29 ENCOUNTER — Encounter: Payer: Self-pay | Admitting: Obstetrics

## 2024-02-12 ENCOUNTER — Encounter (INDEPENDENT_AMBULATORY_CARE_PROVIDER_SITE_OTHER): Payer: Self-pay

## 2024-03-30 ENCOUNTER — Encounter (INDEPENDENT_AMBULATORY_CARE_PROVIDER_SITE_OTHER): Payer: Self-pay | Admitting: Family Medicine

## 2024-03-30 ENCOUNTER — Encounter (INDEPENDENT_AMBULATORY_CARE_PROVIDER_SITE_OTHER): Payer: Self-pay

## 2024-07-22 ENCOUNTER — Ambulatory Visit: Admission: EM | Admit: 2024-07-22 | Discharge: 2024-07-22 | Disposition: A | Discharge status: Home / Self Care

## 2024-07-22 DIAGNOSIS — B079 Viral wart, unspecified: Secondary | ICD-10-CM | POA: Diagnosis not present

## 2024-07-22 NOTE — ED Triage Notes (Signed)
 PATIENT STTAES THAT SHE'S HAD A BUMP ON HER RIGHT RING FINGER THAT HAS GOTTEN BIGGER OVER TIME. Patient states that she's not in any pain with it.

## 2024-07-22 NOTE — Discharge Instructions (Addendum)
-  I excised the wart today. I used a chemical called silver  nitrate to stop bleeding after procedure. This causes a temporary discoloration of skin. Do not scrub the area. Clean gently with soap and water. May apply neosporin. Cover with bandaid until skin closes.  -Return for any signs of infection: redness, swelling, pustular drainage -This may recur. If that occurs please use OTC wart remover and follow up with PCP or dermatology.

## 2024-07-22 NOTE — ED Provider Notes (Signed)
 [facility]    CSN: [id number] Arrival date & time: 07/22/24  1513      History   Chief Complaint Chief Complaint  Patient presents with   Hand Pain    HPI Kathy Burke is a 23 y.o. female presenting for abnormality/swelling of right ring finger x 2 weeks. She says it has been looking worse recently. No significant pain. No redness, pustular drainage or bleeding. The area is oozing clear fluid at time. She has been applying a bandage but no treatment so far. No history of similar skin lesions or warts.  HPI  History reviewed. No pertinent past medical history.  Patient Active Problem List   Diagnosis Date Noted   Morbid obesity (HCC) 12/31/2023   Irregular menses 03/05/2018   ADD (attention deficit disorder) 04/05/2015   Allergic rhinitis 04/05/2015   Arthritis 04/05/2015   Dermatitis, eczematoid 04/05/2015   Family history of diabetes mellitus type II 04/05/2015   Adiposity 04/05/2015    Past Surgical History:  Procedure Laterality Date   pt denies     TONSILLECTOMY  as a baby    OB History   No obstetric history on file.      Home Medications    Prior to Admission medications   Medication Sig Start Date End Date Taking? Authorizing Provider  amphetamine -dextroamphetamine (ADDERALL XR) 20 MG 24 hr capsule Take 1 capsule (20 mg total) by mouth daily. 08/13/19 01/20/21  Vivienne Delon HERO, PA-C  loratadine (CLARITIN) 10 MG tablet Take by mouth.  03/08/21  [provider]    Family History Family History  Problem Relation Age of Onset   Hypertension Mother    Diabetes Mother    Hypertension Father    Cancer Paternal Aunt    Multiple sclerosis Paternal Aunt    Cancer Paternal Uncle    Diabetes Maternal Grandmother    Hypertension Maternal Grandmother    Stroke Maternal Grandmother    Hypertension Maternal Grandfather    Stroke Maternal Grandfather    Diabetes Paternal Grandmother    Cancer Paternal Grandfather     Social  History Social History   Tobacco Use   Smoking status: Never   Smokeless tobacco: Never  Vaping Use   Vaping status: Never Used  Substance Use Topics   Alcohol use: No   Drug use: No     Allergies   Cats claw (uncaria tomentosa), Dog fennel allergy skin test, Pollen extract, Black tea, Egg-derived products, and Wheat   Review of Systems Review of Systems  Musculoskeletal:  Negative for arthralgias and joint swelling.  Skin:  Positive for wound. Negative for color change.  Neurological:  Negative for weakness and numbness.     Physical Exam Triage Vital Signs ED Triage Vitals  Encounter Vitals Group     BP 07/22/24 1533 (!) 155/87     Girls Systolic BP Percentile --      Girls Diastolic BP Percentile --      Boys Systolic BP Percentile --      Boys Diastolic BP Percentile --      Pulse Rate 07/22/24 1533 81     Resp 07/22/24 1533 19     Temp 07/22/24 1533 98.4 F (36.9 C)     Temp Source 07/22/24 1533 Oral     SpO2 07/22/24 1533 100 %     Weight 07/22/24 1532 (!) 325 lb (147.4 kg)     Height --      Head Circumference --  Peak Flow --      Pain Score 07/22/24 1532 0     Pain Loc --      Pain Education --      Exclude from Growth Chart --    No data found.  Updated Vital Signs BP (!) 155/87 (BP Location: Left Wrist)   Pulse 81   Temp 98.4 F (36.9 C) (Oral)   Resp 19   Wt (!) 325 lb (147.4 kg)   SpO2 100%   BMI 45.33 kg/m      Physical Exam Vitals and nursing note reviewed.  Constitutional:      General: She is not in acute distress.    Appearance: Normal appearance. She is not ill-appearing or toxic-appearing.  HENT:     Head: Normocephalic and atraumatic.  Eyes:     General: No scleral icterus.       Right eye: No discharge.        Left eye: No discharge.     Conjunctiva/sclera: Conjunctivae normal.  Cardiovascular:     Rate and Rhythm: Normal rate.     Pulses: Normal pulses.  Pulmonary:     Effort: Pulmonary effort is normal. No  respiratory distress.  Musculoskeletal:     Cervical back: Neck supple.  Skin:    General: Skin is dry.     Comments: RIGHT RING FINGER: Wart of finger with macerated tissue. No bony tenderness.  Neurological:     General: No focal deficit present.     Mental Status: She is alert. Mental status is at baseline.     Motor: No weakness.     Gait: Gait normal.  Psychiatric:        Mood and Affect: Mood normal.        Behavior: Behavior normal.      UC Treatments / Results  Labs (all labs ordered are listed, but only abnormal results are displayed) Labs Reviewed - No data to display  EKG   Radiology No results found.  Procedures Procedures (including critical care time)  Medications Ordered in UC Medications - No data to display  Initial Impression / Assessment and Plan / UC Course  I have reviewed the triage vital signs and the nursing notes.  Pertinent labs & imaging results that were available during my care of the patient were reviewed by me and considered in my medical decision making (see chart for details).   23 y/o female presents for wart of right ring finger x 2 weeks.   Discussed treatment options including prescription topical and occlusive dressing and/or referral to dermatology OR excision of wart in clinic today. Discussed risks of procedure and she decided to have procedure done at urgent care. Consent given for procedure. Cleaned area with alcohol swab. Injected lidocaine and used a 10 blade scalpel to excise wart. Used 2 silver  nitrate sticks to stop bleeding. Covered with band-aid and Coban. Reviewed supportive care and discussed follow up. Work note given.   Final Clinical Impressions(s) / UC Diagnoses   Final diagnoses:  Viral wart on finger     Discharge Instructions      -I excised the wart today. I used a chemical called silver  nitrate to stop bleeding after procedure. This causes a temporary discoloration of skin. Do not scrub the area. Clean  gently with soap and water. May apply neosporin. Cover with bandaid until skin closes.  -Return for any signs of infection: redness, swelling, pustular drainage -This may recur. If that occurs please use OTC  wart remover and follow up with PCP or dermatology.      ED Prescriptions   None    PDMP not reviewed this encounter.   Arvis Jolan NOVAK, PA-C 07/22/24 1655

## 2024-09-10 ENCOUNTER — Encounter: Payer: Self-pay | Admitting: *Deleted

## 2024-09-10 ENCOUNTER — Ambulatory Visit
Admission: EM | Admit: 2024-09-10 | Discharge: 2024-09-10 | Disposition: A | Discharge status: Home / Self Care | Attending: Family Medicine | Admitting: Family Medicine

## 2024-09-10 DIAGNOSIS — K0889 Other specified disorders of teeth and supporting structures: Secondary | ICD-10-CM

## 2024-09-10 MED ORDER — AMOXICILLIN-POT CLAVULANATE 875-125 MG PO TABS: 1.0000 | ORAL_TABLET | Freq: Two times a day (BID) | ORAL | 0 refills | Status: DC

## 2024-09-10 MED ORDER — HYDROCODONE-ACETAMINOPHEN 5-325 MG PO TABS: 2.0000 | ORAL_TABLET | ORAL | 0 refills | Status: DC | PRN

## 2024-09-10 NOTE — ED Provider Notes (Signed)
 [facility]    CSN: [id number] Arrival date & time: 09/10/24  1757      History   Chief Complaint Chief Complaint  Patient presents with   Dental Pain    HPI Kathy Burke is a 23 y.o. female.   HPI  Kathy Burke presents for 2 weeks of right upper and lower dental pain.  She been taking Tylenol  and ibuprofen  for pain.  She has known cavities in the area. No fever. Has a cold right now too.    Has a dental appointment on 11/7 with oral surgeon for evaluation.      History reviewed. No pertinent past medical history.  Patient Active Problem List   Diagnosis Date Noted   Morbid obesity (HCC) 12/31/2023   Irregular menses 03/05/2018   ADD (attention deficit disorder) 04/05/2015   Allergic rhinitis 04/05/2015   Arthritis 04/05/2015   Dermatitis, eczematoid 04/05/2015   Family history of diabetes mellitus type II 04/05/2015   Adiposity 04/05/2015    Past Surgical History:  Procedure Laterality Date   pt denies     TONSILLECTOMY  as a baby    OB History   No obstetric history on file.      Home Medications    Prior to Admission medications   Medication Sig Start Date End Date Taking? Authorizing Provider  amoxicillin -clavulanate (AUGMENTIN) 875-125 MG tablet Take 1 tablet by mouth every 12 (twelve) hours. 09/10/24  Yes Arieana Somoza, DO  HYDROcodone-acetaminophen  (NORCO/VICODIN) 5-325 MG tablet Take 2 tablets by mouth every 4 (four) hours as needed. 09/10/24  Yes Sheriece Jefcoat, DO  amphetamine -dextroamphetamine (ADDERALL XR) 20 MG 24 hr capsule Take 1 capsule (20 mg total) by mouth daily. 08/13/19 01/20/21  Vivienne Delon HERO, PA-C  loratadine (CLARITIN) 10 MG tablet Take by mouth.  03/08/21  [provider]    Family History Family History  Problem Relation Age of Onset   Hypertension Mother    Diabetes Mother    Hypertension Father    Cancer Paternal Aunt    Multiple sclerosis Paternal Aunt    Cancer Paternal Uncle    Diabetes  Maternal Grandmother    Hypertension Maternal Grandmother    Stroke Maternal Grandmother    Hypertension Maternal Grandfather    Stroke Maternal Grandfather    Diabetes Paternal Grandmother    Cancer Paternal Grandfather     Social History Social History   Tobacco Use   Smoking status: Never   Smokeless tobacco: Never  Vaping Use   Vaping status: Never Used  Substance Use Topics   Alcohol use: Yes    Comment: weekend/socially   Drug use: No     Allergies   Pollen extract and Black tea   Review of Systems Review of Systems : negative unless otherwise stated in HPI.      Physical Exam Triage Vital Signs ED Triage Vitals  Encounter Vitals Group     BP 09/10/24 1819 126/86     Girls Systolic BP Percentile --      Girls Diastolic BP Percentile --      Boys Systolic BP Percentile --      Boys Diastolic BP Percentile --      Pulse --      Resp 09/10/24 1819 20     Temp 09/10/24 1819 98.1 F (36.7 C)     Temp Source 09/10/24 1819 Oral     SpO2 09/10/24 1819 97 %     Weight 09/10/24 1815 (!) 398 lb (180.5  kg)     Height --      Head Circumference --      Peak Flow --      Pain Score 09/10/24 1815 8     Pain Loc --      Pain Education --      Exclude from Growth Chart --    No data found.  Updated Vital Signs BP 126/86 (BP Location: Left Arm)   Temp 98.1 F (36.7 C) (Oral)   Resp 20   Wt (!) 180.5 kg   SpO2 97%   BMI 55.51 kg/m   Visual Acuity Right Eye Distance:   Left Eye Distance:   Bilateral Distance:    Right Eye Near:   Left Eye Near:    Bilateral Near:     Physical Exam  GEN:     alert, uncomfortable appearing female in no distress    HENT:  mucus membranes moist, oropharyngeal without lesions exudates or erythema, nasal discharge, right upper and lower molars and gumline tenderness to percussion, no trismus, no secretion pooling, no palpable induration and no visible swelling of the floor the mouth, normal jaw movement without  difficulty EYES:   pupils equal and reactive, no scleral injection or discharge NECK:  normal ROM, no lymphadenopathy, no meningismus   RESP:  no increased work of breathing CVS:   regular rate  Skin:   warm and dry, no rash or skin changes of on external jaw    UC Treatments / Results  Labs (all labs ordered are listed, but only abnormal results are displayed) Labs Reviewed - No data to display  EKG   Radiology No results found.  Procedures Procedures (including critical care time)  Medications Ordered in UC Medications - No data to display  Initial Impression / Assessment and Plan / UC Course  I have reviewed the triage vital signs and the nursing notes.  Pertinent labs & imaging results that were available during my care of the patient were reviewed by me and considered in my medical decision making (see chart for details).     Pt is a 23 y.o. female who presents for 2 weeks of dental pain. Aracelly is afebrile here without recent antipyretics. Satting well on room air. Overall pt is well appearing, well hydrated, without respiratory distress.  Dental exam concerning for dental abscess.  Antibiotics prescribed as below.  - continue Tylenol  with Motrin  as needed for discomfort - Norco prescribed for severe pain - Gargle with salt water several times a day - Follow up with her dentist as scheduled  - Discussed  ED precautions, understanding voiced.   Discussed MDM, treatment plan and plan for follow-up with patient who agrees with plan.   Final Clinical Impressions(s) / UC Diagnoses   Final diagnoses:  Pain, dental     Discharge Instructions      At this time there is no abscess to be drained. You were prescribed an antibiotic. Please take this exactly as directed and do not stop taking it until the entire course of medicine is finished, even if you begin to feel better before finishing the course. We have also given you pain medication; please take this only as  prescribed for severe pain, and do not drink or drive while taking this medication.   Follow up with your dentist as scheduled.      ED Prescriptions     Medication Sig Dispense Auth. Provider   amoxicillin -clavulanate (AUGMENTIN) 875-125 MG tablet Take 1 tablet by  mouth every 12 (twelve) hours. 20 tablet Katniss Weedman, DO   HYDROcodone-acetaminophen  (NORCO/VICODIN) 5-325 MG tablet Take 2 tablets by mouth every 4 (four) hours as needed. 10 tablet Breindel Collier, DO      I have reviewed the PDMP during this encounter.   Kriste Berth, DO 09/10/24 1914

## 2024-09-10 NOTE — ED Triage Notes (Signed)
 Patient states over 2 weeks of dental pain right upper and lower.  States dentist told her she had to wait to be seen after her appt for wisdom tooth eval 11/7 to be seen again.  She states OTC Tylenol  and Ibuprofen  not helping with pain

## 2024-09-10 NOTE — Discharge Instructions (Signed)
 At this time there is no abscess to be drained. You were prescribed an antibiotic. Please take this exactly as directed and do not stop taking it until the entire course of medicine is finished, even if you begin to feel better before finishing the course. We have also given you pain medication; please take this only as prescribed for severe pain, and do not drink or drive while taking this medication.   Follow up with your dentist as scheduled.

## 2024-09-16 ENCOUNTER — Other Ambulatory Visit: Payer: Self-pay

## 2024-09-16 ENCOUNTER — Ambulatory Visit
Admission: EM | Admit: 2024-09-16 | Discharge: 2024-09-16 | Disposition: A | Discharge status: Home / Self Care | Attending: Family Medicine | Admitting: Family Medicine

## 2024-09-16 DIAGNOSIS — K0889 Other specified disorders of teeth and supporting structures: Secondary | ICD-10-CM

## 2024-09-16 MED ORDER — KETOROLAC TROMETHAMINE 30 MG/ML IJ SOLN
30.0000 mg | Freq: Once | INTRAMUSCULAR | Status: AC
  Administered 2024-09-16: 30 mg via INTRAMUSCULAR

## 2024-09-16 MED ORDER — NAPROXEN 500 MG PO TABS
500.0000 mg | ORAL_TABLET | Freq: Two times a day (BID) | ORAL | 0 refills | Status: DC | PRN
  Filled 2024-09-16: qty 14, 7d supply, fill #0

## 2024-09-16 NOTE — ED Provider Notes (Addendum)
 [facility]    CSN: [id number] Arrival date & time: 09/16/24  9170      History   Chief Complaint Chief Complaint  Patient presents with   Dental Pain    HPI Kathy Burke is a 23 y.o. female resents for dental pain.  Patient reports 2 to 2-1/2 weeks of right lower dental/gum pain.  She denies any fevers or facial swelling.  She was seen in urgent care on 10/9 for same complaint and was placed on Augmentin and given Norco for breakthrough pain.  She states that that was the most helpful and she has run out of the Norco and is requesting a prescription.  She is still taking the Augmentin.  She states she has a dental appointment on November 7.  No other concerns at this time   Dental Pain   History reviewed. No pertinent past medical history.  Patient Active Problem List   Diagnosis Date Noted   Morbid obesity (HCC) 12/31/2023   Irregular menses 03/05/2018   ADD (attention deficit disorder) 04/05/2015   Allergic rhinitis 04/05/2015   Arthritis 04/05/2015   Dermatitis, eczematoid 04/05/2015   Family history of diabetes mellitus type II 04/05/2015   Adiposity 04/05/2015    Past Surgical History:  Procedure Laterality Date   pt denies     TONSILLECTOMY  as a baby    OB History   No obstetric history on file.      Home Medications    Prior to Admission medications   Medication Sig Start Date End Date Taking? Authorizing Provider  amoxicillin -clavulanate (AUGMENTIN) 875-125 MG tablet Take 1 tablet by mouth every 12 (twelve) hours. 09/10/24  Yes Brimage, Vondra, DO  naproxen  (NAPROSYN ) 500 MG tablet Take 1 tablet (500 mg total) by mouth 2 (two) times daily as needed (dental pain). 09/17/24  Yes Sallyann Kinnaird, Jodi R, NP  HYDROcodone-acetaminophen  (NORCO/VICODIN) 5-325 MG tablet Take 2 tablets by mouth every 4 (four) hours as needed. 09/10/24   Brimage, Vondra, DO  amphetamine -dextroamphetamine (ADDERALL XR) 20 MG 24 hr capsule Take 1 capsule (20 mg total) by mouth  daily. 08/13/19 01/20/21  Vivienne Delon HERO, PA-C  loratadine (CLARITIN) 10 MG tablet Take by mouth.  03/08/21  [provider]    Family History Family History  Problem Relation Age of Onset   Hypertension Mother    Diabetes Mother    Hypertension Father    Cancer Paternal Aunt    Multiple sclerosis Paternal Aunt    Cancer Paternal Uncle    Diabetes Maternal Grandmother    Hypertension Maternal Grandmother    Stroke Maternal Grandmother    Hypertension Maternal Grandfather    Stroke Maternal Grandfather    Diabetes Paternal Grandmother    Cancer Paternal Grandfather     Social History Social History   Tobacco Use   Smoking status: Never   Smokeless tobacco: Never  Vaping Use   Vaping status: Never Used  Substance Use Topics   Alcohol use: Yes    Comment: weekend/socially   Drug use: No     Allergies   Pollen extract and Black tea   Review of Systems Review of Systems  HENT:  Positive for dental problem.      Physical Exam Triage Vital Signs ED Triage Vitals  Encounter Vitals Group     BP 09/16/24 0840 (!) 159/75     Girls Systolic BP Percentile --      Girls Diastolic BP Percentile --      Boys  Systolic BP Percentile --      Boys Diastolic BP Percentile --      Pulse Rate 09/16/24 0840 88     Resp 09/16/24 0840 18     Temp 09/16/24 0840 98.1 F (36.7 C)     Temp Source 09/16/24 0840 Oral     SpO2 09/16/24 0840 95 %     Weight 09/16/24 0839 (!) 380 lb (172.4 kg)     Height --      Head Circumference --      Peak Flow --      Pain Score 09/16/24 0839 5     Pain Loc --      Pain Education --      Exclude from Growth Chart --    No data found.  Updated Vital Signs BP (!) 159/75 (BP Location: Left Wrist)   Pulse 88   Temp 98.1 F (36.7 C) (Oral)   Resp 18   Wt (!) 380 lb (172.4 kg)   SpO2 95%   BMI 53.00 kg/m   Visual Acuity Right Eye Distance:   Left Eye Distance:   Bilateral Distance:    Right Eye Near:   Left Eye Near:     Bilateral Near:     Physical Exam Vitals and nursing note reviewed.  Constitutional:      General: She is not in acute distress.    Appearance: Normal appearance. She is not ill-appearing.  HENT:     Head: Normocephalic and atraumatic.     Mouth/Throat:      Comments: There is some erythema with tenderness to the gumline below tooth #25 through 28.  There is no induration or fluctuance.  No facial swelling. Eyes:     Pupils: Pupils are equal, round, and reactive to light.  Cardiovascular:     Rate and Rhythm: Normal rate.  Pulmonary:     Effort: Pulmonary effort is normal.  Skin:    General: Skin is warm and dry.  Neurological:     General: No focal deficit present.     Mental Status: She is alert and oriented to person, place, and time.  Psychiatric:        Mood and Affect: Mood normal.        Behavior: Behavior normal.      UC Treatments / Results  Labs (all labs ordered are listed, but only abnormal results are displayed) Labs Reviewed - No data to display  Comprehensive metabolic panel Order: [id number]  Status: Final result     Next appt: None     Dx: Morbid obesity (HCC)   Test Result Released: Yes (seen)     Messages: Seen   1 Result Note     1 Patient Communication     View Follow-Up Encounter          Component Ref Range & Units (hover) 8 mo ago (01/09/24) 4 yr ago (07/12/20) 4 yr ago (02/20/20) 6 yr ago (07/05/18) 7 yr ago (08/29/17) 12 yr ago (12/14/11) 12 yr ago (12/14/11)  Glucose 114 High  82 CM 86 CM 108 High  117 High  R, CM  77 R  BUN 6 7 6 7  R 9 R  11 R  Creatinine, Ser 0.78 0.71 R 0.74 R 0.70 R 0.66 R, CM  0.6 R  eGFR 110        BUN/Creatinine Ratio 8 Low     NOT APPLICABLE R    Sodium 139 137 R 137 R 140  R 138 R  141 R  Potassium 4.2 4.4 R 3.6 R 3.5 R 4.4 R  4.1 R  Chloride 107 High  104 R 103 R 107 R 105 R    CO2 25 25 R 27 R 28 R 26 R    Calcium 9.0 9.0 R 8.9 R 8.8 Low  R 9.0 R    Total Protein 7.7 9.0 High  R 8.5 High  R 8.3 High  R  7.7 R    Albumin 3.8 Low  3.5 R 3.4 Low  R 3.3 Low  R     Globulin, Total 3.9        Bilirubin Total 0.3 0.8 R 0.5 R 0.4 R 0.3 R    Alkaline Phosphatase 60 55 R 55 R 63 R     AST 19 36 R 30 R 19 R 15 R 22 R   ALT 12 25 R 22 R 12 R 9 R 10 R   Resulting Agency [facility]           Narrative Performed by: HOYT Performed at:  [address] [facility] [address], [place], [place]  [id number] Lab Director: Frankey Sas MD, Phone:  [phone]  Specimen Collected: 01/09/24 13:48 Last Resulted: 01/10/24 09:36    EKG   Radiology No results found.  Procedures Procedures (including critical care time)  Medications Ordered in UC Medications  ketorolac (TORADOL) 30 MG/ML injection 30 mg (30 mg Intramuscular Given 09/16/24 0857)    Initial Impression / Assessment and Plan / UC Course  I have reviewed the triage vital signs and the nursing notes.  Pertinent labs & imaging results that were available during my care of the patient were reviewed by me and considered in my medical decision making (see chart for details).     I reviewed concerns with patient.  Patient presenting with persistent dental pain on Augmentin.  Discussed unable to refill narcotics but will do Toradol injection and Rx for naproxen  that she can start tomorrow, 10/16.  I also gave her contact information for an emergency dental clinic in [place] that do same-day or next day appointments.  Strict ER precautions reviewed and patient verbalized understanding. Final Clinical Impressions(s) / UC Diagnoses   Final diagnoses:  Pain, dental     Discharge Instructions      You were given a Toradol injection in clinic today. Do not take any over the counter NSAID's such as Advil , ibuprofen , Aleve , or naproxen  for 24 hours. You may take tylenol  if needed.  You may start naproxen  twice daily as needed for your pain tomorrow, 10/16.  Please contact the emergency dental office  with the information below to see if you are able to be seen today or tomorrow for further evaluation of your symptoms.  Please go to the ER for any worsening symptoms such as fever or facial swelling.  I hope you feel better soon!      ED Prescriptions     Medication Sig Dispense Auth. Provider   naproxen  (NAPROSYN ) 500 MG tablet Take 1 tablet (500 mg total) by mouth 2 (two) times daily as needed (dental pain). 14 tablet Zyairah Wacha, Jodi R, NP      PDMP not reviewed this encounter.   Loreda Myla SAUNDERS, NP 09/16/24 0900    Loreda Myla SAUNDERS, NP 09/16/24 [phone]

## 2024-09-16 NOTE — ED Triage Notes (Signed)
 Patient states that she's here for a refill for noroco. Patient states that she's still having dental pain. She's using IBU that doesn't seem to help. Patient is still taking abx. Patient has appointment with dentist on 11/7.

## 2024-09-16 NOTE — Discharge Instructions (Addendum)
 You were given a Toradol injection in clinic today. Do not take any over the counter NSAID's such as Advil , ibuprofen , Aleve , or naproxen  for 24 hours. You may take tylenol  if needed.  You may start naproxen  twice daily as needed for your pain tomorrow, 10/16.  Please contact the emergency dental office with the information below to see if you are able to be seen today or tomorrow for further evaluation of your symptoms.  Please go to the ER for any worsening symptoms such as fever or facial swelling.  I hope you feel better soon!

## 2024-10-23 ENCOUNTER — Encounter: Payer: Self-pay | Admitting: Physician Assistant

## 2024-10-23 ENCOUNTER — Ambulatory Visit (INDEPENDENT_AMBULATORY_CARE_PROVIDER_SITE_OTHER): Admitting: Physician Assistant

## 2024-10-23 ENCOUNTER — Other Ambulatory Visit (HOSPITAL_COMMUNITY)
Admission: RE | Admit: 2024-10-23 | Discharge: 2024-10-23 | Disposition: A | Discharge status: Home / Self Care | Source: Ambulatory Visit | Attending: Physician Assistant | Admitting: Physician Assistant

## 2024-10-23 VITALS — BP 125/68 | HR 96 | Resp 14 | Ht 71.0 in | Wt >= 6400 oz

## 2024-10-23 DIAGNOSIS — D649 Anemia, unspecified: Secondary | ICD-10-CM | POA: Diagnosis not present

## 2024-10-23 DIAGNOSIS — Z0000 Encounter for general adult medical examination without abnormal findings: Secondary | ICD-10-CM | POA: Insufficient documentation

## 2024-10-23 DIAGNOSIS — Z23 Encounter for immunization: Secondary | ICD-10-CM

## 2024-10-23 DIAGNOSIS — Z113 Encounter for screening for infections with a predominantly sexual mode of transmission: Secondary | ICD-10-CM | POA: Insufficient documentation

## 2024-10-23 DIAGNOSIS — N926 Irregular menstruation, unspecified: Secondary | ICD-10-CM | POA: Insufficient documentation

## 2024-10-23 DIAGNOSIS — Z0001 Encounter for general adult medical examination with abnormal findings: Secondary | ICD-10-CM | POA: Diagnosis not present

## 2024-10-23 NOTE — Progress Notes (Unsigned)
 Complete physical exam  Patient: Kathy Burke   DOB: Mar 11, 2001   23 y.o. Female  MRN: [id number] Visit Date: 10/23/2024  Today's healthcare provider: Jolynn Spencer, PA-C   Chief Complaint  Patient presents with   Annual Exam    Last Exam Physical: none done  Diet: good Exercise: works at [employer] so does a lot of walking Feeling: good Sleeping: 8 hours No concerns   Subjective    Kathy Burke is a 23 y.o. female who presents today for a complete physical exam.    Discussed the use of AI scribe software for clinical note transcription with the patient, who gave verbal consent to proceed.  History of Present Illness Kathy Burke is a 23 year old female who presents for a routine physical exam and follow-up on anemia and lipid issues.  She adheres to her diet and exercise regimen, which includes physical activity at her job and flexibility exercises. She feels well overall and sleeps eight hours per night. Her hemoglobin was previously low, and her lipid levels were problematic, but her A1c was good. Her last blood work in February included TSH, lipids, and A1c.  She has received flu and tetanus vaccinations. Her last Pap smear was some time ago, and she has been referred to an OBGYN for further evaluation. Menstrual periods are irregular, occurring once and then skipping a few months, without heavy bleeding. She denies sexual activity and any possibility of pregnancy.  No issues with anxiety or depression. Bowel movements and urination are normal, with a slight smell in her urine but no vaginal discharge. No chest pain, shortness of breath, or palpitations.    Last depression screening scores    10/23/2024    2:05 PM 12/30/2023    3:12 PM 02/07/2021    1:54 PM  PHQ 2/9 Scores  PHQ - 2 Score 0 0 0  PHQ- 9 Score 0 0  0      Data saved with a previous flowsheet row definition   Last fall risk screening    10/23/2024    2:05 PM  Fall Risk   Falls in the past  year? 0  Number falls in past yr: 0  Injury with Fall? 0  Risk for fall due to : No Fall Risks  Follow up Falls evaluation completed   Last Audit-C alcohol use screening    10/23/2024    1:55 PM  Alcohol Use Disorder Test (AUDIT)  1. How often do you have a drink containing alcohol? 1  2. How many drinks containing alcohol do you have on a typical day when you are drinking? 0  3. How often do you have six or more drinks on one occasion? 0  AUDIT-C Score 1   A score of 3 or more in women, and 4 or more in men indicates increased risk for alcohol abuse, EXCEPT if all of the points are from question 1   History reviewed. No pertinent past medical history. Past Surgical History:  Procedure Laterality Date   pt denies     TONSILLECTOMY  as a baby   Social History   Socioeconomic History   Marital status: Single    Spouse name: Not on file   Number of children: Not on file   Years of education: 9th Grade   Highest education level: Not on file  Occupational History   Occupation: Full Time Student    Comment: Freight Forwarder  Tobacco Use   Smoking  status: Never   Smokeless tobacco: Never  Vaping Use   Vaping status: Never Used  Substance and Sexual Activity   Alcohol use: Yes    Comment: weekend/socially   Drug use: No   Sexual activity: Not Currently  Other Topics Concern   Not on file  Social History Narrative   Not on file   Social Drivers of Health   Financial Resource Strain: Low Risk  (10/23/2024)   Overall Financial Resource Strain (CARDIA)    Difficulty of Paying Living Expenses: Not hard at all  Food Insecurity: No Food Insecurity (10/23/2024)   Hunger Vital Sign    Worried About Running Out of Food in the Last Year: Never true    Ran Out of Food in the Last Year: Never true  Transportation Needs: No Transportation Needs (10/23/2024)   PRAPARE - Administrator, Civil Service (Medical): No    Lack of Transportation (Non-Medical): No   Physical Activity: Sufficiently Active (10/23/2024)   Exercise Vital Sign    Days of Exercise per Week: 5 days    Minutes of Exercise per Session: 50 min  Stress: No Stress Concern Present (10/23/2024)   [employer] of Occupational Health - Occupational Stress Questionnaire    Feeling of Stress: Not at all  Social Connections: Moderately Integrated (10/23/2024)   Social Connection and Isolation Panel    Frequency of Communication with Friends and Family: More than three times a week    Frequency of Social Gatherings with Friends and Family: More than three times a week    Attends Religious Services: More than 4 times per year    Active Member of [employer] or Organizations: No    Attends Engineer, Structural: More than 4 times per year    Marital Status: Never married  Intimate Partner Violence: Not At Risk (10/23/2024)   Humiliation, Afraid, Rape, and Kick questionnaire    Fear of Current or Ex-Partner: No    Emotionally Abused: No    Physically Abused: No    Sexually Abused: No   Family Status  Relation Name Status   Mother  Alive   Father  Alive   Bruna Nyhan  (Not Specified)   Bruna Brigham  (Not Specified)   MGM  Alive   MGF  (Not Specified)   PGM  Alive   PGF  (Not Specified)  No partnership data on file   Family History  Problem Relation Age of Onset   Hypertension Mother    Diabetes Mother    Hypertension Father    Cancer Paternal Aunt    Multiple sclerosis Paternal Aunt    Cancer Paternal Uncle    Diabetes Maternal Grandmother    Hypertension Maternal Grandmother    Stroke Maternal Grandmother    Hypertension Maternal Grandfather    Stroke Maternal Grandfather    Diabetes Paternal Grandmother    Cancer Paternal Grandfather    Allergies  Allergen Reactions   Pollen Extract Hives   Black Tea Hives    Patient Care Team: Quanda Pavlicek, PA-C as PCP - General (Physician Assistant)   Medications: Outpatient Medications Prior to Visit  Medication Sig    [DISCONTINUED] amoxicillin -clavulanate (AUGMENTIN ) 875-125 MG tablet Take 1 tablet by mouth every 12 (twelve) hours.   [DISCONTINUED] HYDROcodone -acetaminophen  (NORCO/VICODIN) 5-325 MG tablet Take 2 tablets by mouth every 4 (four) hours as needed.   [DISCONTINUED] naproxen  (NAPROSYN ) 500 MG tablet Take 1 tablet (500 mg total) by mouth 2 (two) times daily as needed (  dental pain).   No facility-administered medications prior to visit.    Review of Systems Except see HPI  {Insert previous labs (optional):23779} {See past labs  Heme  Chem  Endocrine  Serology  Results Review (optional):1}  Objective    BP 125/68   Pulse 96   Resp 14   Ht 5' 11 (1.803 m)   Wt (!) 407 lb 14.4 oz (185 kg)   SpO2 100%   BMI 56.89 kg/m  {Insert last BP/Wt (optional):23777}{See vitals history (optional):1}    Physical Exam Vitals reviewed.  Constitutional:      General: She is not in acute distress.    Appearance: Normal appearance. She is well-developed. She is not ill-appearing, toxic-appearing or diaphoretic.  HENT:     Head: Normocephalic and atraumatic.     Right Ear: Tympanic membrane, ear canal and external ear normal.     Left Ear: Tympanic membrane, ear canal and external ear normal.     Nose: Nose normal. No congestion or rhinorrhea.     Mouth/Throat:     Mouth: Mucous membranes are moist.     Pharynx: Oropharynx is clear. No oropharyngeal exudate.  Eyes:     General: No scleral icterus.       Right eye: No discharge.        Left eye: No discharge.     Conjunctiva/sclera: Conjunctivae normal.     Pupils: Pupils are equal, round, and reactive to light.  Neck:     Thyroid : No thyromegaly.     Vascular: No carotid bruit.  Cardiovascular:     Rate and Rhythm: Normal rate and regular rhythm.     Pulses: Normal pulses.     Heart sounds: Normal heart sounds. No murmur heard.    No friction rub. No gallop.  Pulmonary:     Effort: Pulmonary effort is normal. No respiratory  distress.     Breath sounds: Normal breath sounds. No wheezing or rales.  Abdominal:     General: Abdomen is flat. Bowel sounds are normal. There is no distension.     Palpations: Abdomen is soft. There is no mass.     Tenderness: There is no abdominal tenderness. There is no right CVA tenderness, left CVA tenderness, guarding or rebound.     Hernia: No hernia is present.  Musculoskeletal:        General: No swelling, tenderness, deformity or signs of injury. Normal range of motion.     Cervical back: Normal range of motion and neck supple. No rigidity or tenderness.     Right lower leg: No edema.     Left lower leg: No edema.  Lymphadenopathy:     Cervical: No cervical adenopathy.  Skin:    General: Skin is warm and dry.     Coloration: Skin is not jaundiced or pale.     Findings: No bruising, erythema, lesion or rash.  Neurological:     Mental Status: She is alert and oriented to person, place, and time. Mental status is at baseline.     Gait: Gait normal.  Psychiatric:        Mood and Affect: Mood normal.        Behavior: Behavior normal.        Thought Content: Thought content normal.        Judgment: Judgment normal.      No results found for any visits on 10/23/24.  Assessment & Plan    Routine Health Maintenance and Physical Exam  Exercise Activities and Dietary recommendations  Goals   None     Immunization History  Administered Date(s) Administered   DTaP 09/09/2001, 11/17/2001, 01/20/2002, 08/06/2002, 11/23/2002, 03/28/2006   HIB (PRP-OMP) 09/09/2001, 11/17/2001, 01/20/2002, 08/06/2002   HPV 9-valent 08/21/2017, 12/23/2017   HPV Quadrivalent 07/07/2013   Hepatitis A 05/09/2012, 07/07/2013   Hepatitis B 07/08/01, 09/09/2001, 11/23/2002   IPV 09/09/2001, 11/17/2001, 01/20/2002, 03/28/2006   Influenza Split 12/14/2011, 10/24/2012   Influenza, Seasonal, Injecte, Preservative Fre 10/23/2024   Influenza,inj,Quad PF,6+ Mos 08/21/2017   MMR 08/06/2002,  03/28/2006   Meningococcal B, OMV 07/13/2019   Meningococcal Conjugate 07/07/2013   Meningococcal Mcv4o 07/13/2019   PFIZER(Purple Top)SARS-COV-2 Vaccination 04/16/2020, 05/10/2020   PPD Test 11/21/2022   Pneumococcal-Unspecified 11/23/2002, 03/04/2003   Tdap 05/09/2012, 10/23/2024   Varicella 08/06/2002, 03/28/2006    Health Maintenance  Topic Date Due   Hepatitis C Screening  Never done   Meningitis B Vaccine (2 of 2 - Bexsero SCDM 2-dose series) 01/13/2020   Pap Smear  Never done   COVID-19 Vaccine (3 - 2025-26 season) 08/03/2024   DTaP/Tdap/Td vaccine (8 - Td or Tdap) 10/23/2034   Flu Shot  Completed   Hepatitis B Vaccine  Completed   HPV Vaccine  Completed   HIV Screening  Completed   Pneumococcal Vaccine  Aged Out    Discussed health benefits of physical activity, and encouraged her to engage in regular exercise appropriate for her age and condition. Assessment & Plan Annual physical exam (Primary) Routine wellness visit with no acute concerns. Good adherence to diet and exercise. No issues with chest pain, shortness of breath, or palpitations. Reports strange smell in urine. Recent eye and dental exams completed. - Scheduled follow-up in one year for physical examination. UTD on dental/eye exams Things to do to keep yourself healthy  - Exercise at least 30-45 minutes a day, 3-4 days a week.  - Eat a low-fat diet with lots of fruits and vegetables, up to 7-9 servings per day.  - Seatbelts can save your life. Wear them always.  - Smoke detectors on every level of your home, check batteries every year.  - Eye Doctor - have an eye exam every 1-2 years  - Safe sex - if you may be exposed to STDs, use a condom.  - Alcohol -  If you drink, do it moderately, less than 2 drinks per day.  - Health Care Power of Attorney. Choose someone to speak for you if you are not able.  - Depression is common in our stressful world.If you're feeling down or losing interest in things you  normally enjoy, please come in for a visit.  - Violence - If anyone is threatening or hurting you, please call immediately.  Morbid obesity due to excess calories Morbid obesity managed with diet and exercise. Reports feeling good with current regimen. - Continue current diet and exercise regimen.  Anemia Low hemoglobin levels, likely related to menstrual irregularity. - Ordered CBC to recheck hemoglobin levels.  Menstrual irregularity Irregular menstrual cycles with occasional skipped months. Possible connection to anemia. - Referred to OBGYN for further evaluation and management.  Screening for sexually transmitted infections Screening for bacterial vaginosis and chlamydia discussed. Agreed to recheck for these infections. - Ordered screening for bacterial vaginosis and chlamydia.  Immunization review and update Immunization status reviewed. Received flu and tetanus vaccines. Meningococcal and COVID vaccines discussed but not administered today due to availability issues. - Schedule meningococcal and COVID vaccinations at a later date.  Immunization  due  - Flu vaccine trivalent PF, 6mos and older(Flulaval,Afluria,Fluarix,Fluzone)  Morbid obesity (HCC)  - Lipid Panel With LDL/HDL Ratio - Hepatitis C antibody - CBC w/Diff/Platelet - Basic metabolic panel with GFR  Screening for STD (sexually transmitted disease)  - Cervicovaginal ancillary only  Menstrual irregularity   Anemia, unspecified type   No follow-ups on file.    The patient was advised to call back or seek an in-person evaluation if the symptoms worsen or if the condition fails to improve as anticipated.  I discussed the assessment and treatment plan with the patient. The patient was provided an opportunity to ask questions and all were answered. The patient agreed with the plan and demonstrated an understanding of the instructions.  I, Kellar Westberg, PA-C have reviewed all documentation for this visit. The  documentation on 10/23/2024  for the exam, diagnosis, procedures, and orders are all accurate and complete.  Jolynn Spencer, [facility], MMS [facility] [phone] (phone) [phone] (fax)  [facility]

## 2024-10-24 LAB — CBC WITH DIFFERENTIAL/PLATELET
Basophils Absolute: 0 x10E3/uL (ref 0.0–0.2)
Basos: 0 %
EOS (ABSOLUTE): 0.3 x10E3/uL (ref 0.0–0.4)
Eos: 3 %
Hematocrit: 31.3 % — ABNORMAL LOW (ref 34.0–46.6)
Hemoglobin: 9.2 g/dL — ABNORMAL LOW (ref 11.1–15.9)
Immature Grans (Abs): 0 x10E3/uL (ref 0.0–0.1)
Immature Granulocytes: 0 %
Lymphocytes Absolute: 2.7 x10E3/uL (ref 0.7–3.1)
Lymphs: 34 %
MCH: 21.7 pg — ABNORMAL LOW (ref 26.6–33.0)
MCHC: 29.4 g/dL — ABNORMAL LOW (ref 31.5–35.7)
MCV: 74 fL — ABNORMAL LOW (ref 79–97)
Monocytes Absolute: 0.5 x10E3/uL (ref 0.1–0.9)
Monocytes: 6 %
Neutrophils Absolute: 4.5 x10E3/uL (ref 1.4–7.0)
Neutrophils: 56 %
Platelets: 486 x10E3/uL — ABNORMAL HIGH (ref 150–450)
RBC: 4.23 x10E6/uL (ref 3.77–5.28)
RDW: 16.2 % — ABNORMAL HIGH (ref 11.7–15.4)
WBC: 8.1 x10E3/uL (ref 3.4–10.8)

## 2024-10-24 LAB — LIPID PANEL WITH LDL/HDL RATIO
Cholesterol, Total: 137 mg/dL (ref 100–199)
HDL: 36 mg/dL — ABNORMAL LOW (ref >39)
LDL Chol Calc (NIH): 81 mg/dL (ref 0–99)
LDL/HDL Ratio: 2.3 ratio (ref 0.0–3.2)
Triglycerides: 109 mg/dL (ref 0–149)
VLDL Cholesterol Cal: 20 mg/dL (ref 5–40)

## 2024-10-24 LAB — BASIC METABOLIC PANEL WITH GFR
BUN/Creatinine Ratio: 10 (ref 9–23)
BUN: 7 mg/dL (ref 6–20)
CO2: 24 mmol/L (ref 20–29)
Calcium: 9.1 mg/dL (ref 8.7–10.2)
Chloride: 103 mmol/L (ref 96–106)
Creatinine, Ser: 0.69 mg/dL (ref 0.57–1.00)
Glucose: 93 mg/dL (ref 70–99)
Potassium: 4.5 mmol/L (ref 3.5–5.2)
Sodium: 139 mmol/L (ref 134–144)
eGFR: 125 mL/min/1.73 (ref >59)

## 2024-10-24 LAB — HEPATITIS C ANTIBODY: Hep C Virus Ab: NONREACTIVE

## 2024-10-26 ENCOUNTER — Ambulatory Visit: Payer: Self-pay | Admitting: Physician Assistant

## 2024-10-26 DIAGNOSIS — B9689 Other specified bacterial agents as the cause of diseases classified elsewhere: Secondary | ICD-10-CM

## 2024-10-27 LAB — CERVICOVAGINAL ANCILLARY ONLY
Bacterial Vaginitis (gardnerella): POSITIVE — AB
Candida Glabrata: NEGATIVE
Candida Vaginitis: POSITIVE — AB
Chlamydia: NEGATIVE
Comment: NEGATIVE
Comment: NEGATIVE
Comment: NEGATIVE
Comment: NEGATIVE
Comment: NEGATIVE
Comment: NORMAL
Neisseria Gonorrhea: NEGATIVE
Trichomonas: NEGATIVE

## 2024-10-28 NOTE — Progress Notes (Signed)
 If patient agrees, please place a referral to hematology for microcytic anemia

## 2024-11-04 ENCOUNTER — Encounter: Payer: Self-pay | Admitting: Physician Assistant

## 2024-11-04 ENCOUNTER — Other Ambulatory Visit: Payer: Self-pay

## 2024-11-04 DIAGNOSIS — B9689 Other specified bacterial agents as the cause of diseases classified elsewhere: Secondary | ICD-10-CM

## 2024-11-04 MED ORDER — METRONIDAZOLE 500 MG PO TABS: 500.0000 mg | ORAL_TABLET | Freq: Two times a day (BID) | ORAL | 0 refills | Status: AC

## 2024-11-04 MED ORDER — METRONIDAZOLE 500 MG PO TABS
500.0000 mg | ORAL_TABLET | Freq: Two times a day (BID) | ORAL | 0 refills | Status: DC
  Filled 2024-11-04: qty 14, 7d supply, fill #0

## 2024-11-17 ENCOUNTER — Encounter: Payer: Self-pay | Admitting: Physician Assistant

## 2024-12-22 ENCOUNTER — Other Ambulatory Visit: Payer: Self-pay

## 2024-12-22 ENCOUNTER — Ambulatory Visit
Admission: RE | Admit: 2024-12-22 | Discharge: 2024-12-22 | Disposition: A | Discharge status: Home / Self Care | Source: Ambulatory Visit | Attending: Emergency Medicine | Admitting: Emergency Medicine

## 2024-12-22 ENCOUNTER — Ambulatory Visit: Payer: Self-pay | Admitting: Emergency Medicine

## 2024-12-22 VITALS — BP 153/62 | HR 86 | Temp 98.5°F | Resp 18 | Wt >= 6400 oz

## 2024-12-22 DIAGNOSIS — J029 Acute pharyngitis, unspecified: Secondary | ICD-10-CM

## 2024-12-22 DIAGNOSIS — J069 Acute upper respiratory infection, unspecified: Secondary | ICD-10-CM | POA: Diagnosis not present

## 2024-12-22 LAB — POC COVID19/FLU A&B COMBO
Covid Antigen, POC: NEGATIVE
Influenza A Antigen, POC: NEGATIVE
Influenza B Antigen, POC: NEGATIVE

## 2024-12-22 LAB — POCT RAPID STREP A (OFFICE): Rapid Strep A Screen: NEGATIVE

## 2024-12-22 MED ORDER — FLUTICASONE PROPIONATE 50 MCG/ACT NA SUSP
2.0000 | Freq: Every day | NASAL | 0 refills | Status: AC
  Filled 2024-12-22: qty 16, 30d supply, fill #0

## 2024-12-22 MED ORDER — AEROCHAMBER MV MISC
1 refills | Status: AC
  Filled 2024-12-22: qty 1, 1d supply, fill #0

## 2024-12-22 MED ORDER — ALBUTEROL SULFATE HFA 108 (90 BASE) MCG/ACT IN AERS
1.0000 | INHALATION_SPRAY | RESPIRATORY_TRACT | 0 refills | Status: AC | PRN
  Filled 2024-12-22: qty 6.7, 25d supply, fill #0

## 2024-12-22 MED ORDER — IBUPROFEN 600 MG PO TABS
600.0000 mg | ORAL_TABLET | Freq: Four times a day (QID) | ORAL | 0 refills | Status: AC | PRN
  Filled 2024-12-22: qty 30, 8d supply, fill #0

## 2024-12-22 MED ORDER — PROMETHAZINE-DM 6.25-15 MG/5ML PO SYRP
2.5000 mL | ORAL_SOLUTION | Freq: Four times a day (QID) | ORAL | 0 refills | Status: AC | PRN
  Filled 2024-12-22: qty 118, 12d supply, fill #0

## 2024-12-22 NOTE — Discharge Instructions (Addendum)
 Your rapid strep was negative today.  We have sent it off for culture to confirm absence of strep throat.  If it is positive, we will contact you and call in the appropriate antibiotics.  1 gram of Tylenol  and 600 mg ibuprofen  together 3-4 times a day as needed for pain.  Make sure you drink plenty of extra fluids.  Some people find salt water gargles and  Traditional Medicinal's Throat Coat tea helpful. Take 5 mL of liquid Benadryl and 5 mL of Maalox/Mylanta. Mix it together, and then hold it in your mouth for as long as you can and then swallow. You may do this 4 times a day.  Honey and lemon dissolved in hot water can also be soothing.  Promethazine  DM for cough, Mucinex D, saline nasal irrigation with a NeilMed sinus rinse and distilled water as often as you want, Flonase  for nasal congestion and cough. 2 puffs from an albuterol  inhaler with a spacer every 4-6 hours as needed for coughing, wheezing.  Have the pharmacist show you how to use the inhaler with a spacer.  Go to www.goodrx.com  or www.costplusdrugs.com to look up your medications. This will give you a list of where you can find your prescriptions at the most affordable prices. Or ask the pharmacist what the cash price is, or if they have any other discount programs available to help make your medication more affordable. This can be less expensive than what you would pay with insurance.

## 2024-12-22 NOTE — ED Triage Notes (Signed)
 Patient to Urgent Care with complaints of dry cough/ sore throat. No fevers.   Symptoms x5-6 days.  Using tylenol  and motrin .

## 2024-12-22 NOTE — ED Provider Notes (Signed)
 " HPI  SUBJECTIVE:  Patient reports sore throat starting 2 days ago.  She has been taking an unknown combo cold and flu medication with improvement in her symptoms.  No aggravating factors.  Last dose of cold medication was within the past 6 hours. 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  He has a past medical history of BMI above 30. LMP: This month.  Denies possibility of being pregnant PCP: [facility].   History reviewed. No pertinent past medical history.  Past Surgical History:  Procedure Laterality Date   pt denies     TONSILLECTOMY  as a baby    Family History  Problem Relation Age of Onset   Hypertension Mother    Diabetes Mother    Hypertension Father    Cancer Paternal Aunt    Multiple sclerosis Paternal Aunt    Cancer Paternal Uncle    Diabetes Maternal Grandmother    Hypertension Maternal Grandmother    Stroke Maternal Grandmother    Hypertension Maternal Grandfather    Stroke Maternal Grandfather    Diabetes Paternal Grandmother    Cancer Paternal Grandfather     Social History[1]  Current Medications[2]  Allergies[3]   ROS  As noted in HPI.   Physical Exam  BP (!) 153/62   Pulse 86   Temp 98.5 F (36.9 C)   Resp 18   Wt (!) 182.8 kg   LMP 12/08/2024 (Approximate)   SpO2 100%   BMI 56.22 kg/m   Constitutional: Well developed, well nourished, no acute  distress Eyes:  EOMI, conjunctiva normal bilaterally HENT: Normocephalic, atraumatic,mucus membranes moist.  Positive nasal congestion.  Erythematous, swollen turbinates.  No maxillary, frontal sinus tenderness.  Slightly erythematous oropharynx, tonsils normal-sized without exudates.  Uvula midline.  No postnasal drip.   No neck stiffness.  Normal voice.  No drooling, trismus. Respiratory: Normal inspiratory effort, lungs clear bilaterally. Cardiovascular: Normal rate, no murmurs, rubs, gallops GI: nondistended,  skin: No rash, skin intact Lymph: Positive anterior cervical LN.  No posterior cervical lymphadenopathy. Musculoskeletal: no deformities Neurologic: Alert & oriented x 3, no focal neuro deficits Psychiatric: Speech and behavior appropriate.   ED Course   Medications - No data to display  Orders Placed This Encounter  Procedures   Culture, group A strep (throat)    Standing Status:   Standing    Number of Occurrences:   1   POC rapid strep A    Standing Status:   Standing    Number of Occurrences:   1   POC Covid19/Flu A&B Antigen    Standing Status:   Standing    Number of Occurrences:   1    Results for orders placed or performed during the hospital encounter of 12/22/24 (from the past 24 hours)  POC rapid strep A     Status: Normal   Collection Time: 12/22/24  1:35 PM  Result Value Ref Range   Rapid Strep A Screen Negative Negative   No results found.  ED Clinical Impression  1. Sore throat   2. Viral URI with cough      ED Assessment/Plan   Checking COVID, flu, strep.  Will treat accordingly.  Rapid strep negative.  Will send off throat culture to confirm absence of strep throat.  If positive, we will call in the appropriate antibiotics.  In the meantime, patient home with ibuprofen , Tylenol , Benadryl/Maalox mixture.  Promethazine  DM for cough, Mucinex D, saline nasal occasion, Flonase  for nasal congestion and cough.  2 puffs from an albuterol  inhaler  with a spacer every 4-6 hours as needed for coughing, wheezing.  Work note. Patient to followup with PCP when necessary  Discussed labs,  MDM, plan and followup with patient. . patient agrees with plan.   Meds ordered this encounter  Medications   albuterol  (VENTOLIN  HFA) 108 (90 Base) MCG/ACT inhaler    Sig: Inhale 1-2 puffs into the lungs every 4 (four) hours as needed for wheezing or shortness of breath.    Dispense:  1 each    Refill:  0   fluticasone  (FLONASE ) 50 MCG/ACT nasal spray    Sig: Place 2 sprays into both nostrils daily.    Dispense:  16 g    Refill:  0   ibuprofen  (ADVIL ) 600 MG tablet    Sig: Take 1 tablet (600 mg total) by mouth every 6 (six) hours as needed.    Dispense:  30 tablet    Refill:  0   promethazine -dextromethorphan (PROMETHAZINE -DM) 6.25-15 MG/5ML syrup    Sig: Take 2.5 mLs by mouth 4 (four) times daily as needed for cough. Take 2.5 to 5 mL every 6 hours as needed    Dispense:  118 mL    Refill:  0   Spacer/Aero-Holding Chambers (AEROCHAMBER MV) inhaler    Sig: Use as instructed    Dispense:  1 each    Refill:  1     *This clinic note was created using Scientist, clinical (histocompatibility and immunogenetics). Therefore, there may be occasional mistakes despite careful proofreading.       [1]  Social History Tobacco Use   Smoking status: Never   Smokeless tobacco: Never  Vaping Use   Vaping status: Never Used  Substance Use Topics   Alcohol use: Yes    Comment: weekend/socially   Drug use: No  [2] No current facility-administered medications for this encounter.  Current Outpatient Medications:    albuterol  (VENTOLIN  HFA) 108 (90 Base) MCG/ACT inhaler, Inhale 1-2 puffs into the lungs every 4 (four) hours as needed for wheezing or shortness of breath., Disp: 1 each, Rfl: 0   fluticasone  (FLONASE ) 50 MCG/ACT nasal spray, Place 2 sprays into both nostrils daily., Disp: 16 g, Rfl: 0   ibuprofen  (ADVIL ) 600 MG tablet, Take 1 tablet (600 mg total) by mouth every 6 (six)  hours as needed., Disp: 30 tablet, Rfl: 0   promethazine -dextromethorphan (PROMETHAZINE -DM) 6.25-15 MG/5ML syrup, Take 2.5 mLs by mouth 4 (four) times daily as needed for cough. Take 2.5 to 5 mL every 6 hours as needed, Disp: 118 mL, Rfl: 0   Spacer/Aero-Holding Chambers (AEROCHAMBER MV) inhaler, Use as instructed, Disp: 1 each, Rfl: 1 [3]  Allergies Allergen Reactions   Pollen Extract Hives   Black Tea Hives     Shevonne Wolf, MD 12/24/24 1251  "

## 2024-12-25 LAB — CULTURE, GROUP A STREP (THRC)

## 2025-10-25 ENCOUNTER — Encounter: Admitting: Physician Assistant
